# Patient Record
Sex: Male | Born: 1955 | Smoking: Never smoker
Health system: Southern US, Community
[De-identification: ages and names within clinical notes are randomized; demographics above are authoritative.]

## PROBLEM LIST (undated history)

## (undated) DIAGNOSIS — Q858 Other phakomatoses, not elsewhere classified: Secondary | ICD-10-CM

## (undated) DIAGNOSIS — Q8589 Other phakomatoses, not elsewhere classified: Secondary | ICD-10-CM

## (undated) DIAGNOSIS — M109 Gout, unspecified: Secondary | ICD-10-CM

## (undated) DIAGNOSIS — I10 Essential (primary) hypertension: Secondary | ICD-10-CM

## (undated) DIAGNOSIS — E119 Type 2 diabetes mellitus without complications: Secondary | ICD-10-CM

## (undated) DIAGNOSIS — E781 Pure hyperglyceridemia: Secondary | ICD-10-CM

## (undated) DIAGNOSIS — F79 Unspecified intellectual disabilities: Secondary | ICD-10-CM

## (undated) HISTORY — DX: Gout, unspecified: M10.9

## (undated) HISTORY — PX: PORTACATH PLACEMENT: SHX2246

## (undated) HISTORY — DX: Pure hyperglyceridemia: E78.1

## (undated) HISTORY — DX: Unspecified intellectual disabilities: F79

## (undated) HISTORY — DX: Other phakomatoses, not elsewhere classified: Q85.8

## (undated) HISTORY — PX: TONGUE SURGERY: SHX810

## (undated) HISTORY — DX: Essential (primary) hypertension: I10

## (undated) HISTORY — DX: Type 2 diabetes mellitus without complications: E11.9

## (undated) HISTORY — DX: Other phakomatoses, not elsewhere classified: Q85.89

---

## 2015-07-15 DIAGNOSIS — I10 Essential (primary) hypertension: Secondary | ICD-10-CM | POA: Diagnosis not present

## 2015-07-15 DIAGNOSIS — E1165 Type 2 diabetes mellitus with hyperglycemia: Secondary | ICD-10-CM | POA: Diagnosis not present

## 2015-07-16 DIAGNOSIS — E119 Type 2 diabetes mellitus without complications: Secondary | ICD-10-CM | POA: Diagnosis not present

## 2015-07-16 DIAGNOSIS — H40033 Anatomical narrow angle, bilateral: Secondary | ICD-10-CM | POA: Diagnosis not present

## 2015-08-11 DIAGNOSIS — I10 Essential (primary) hypertension: Secondary | ICD-10-CM | POA: Diagnosis not present

## 2015-08-11 DIAGNOSIS — Q858 Other phakomatoses, not elsewhere classified: Secondary | ICD-10-CM | POA: Diagnosis not present

## 2015-08-11 DIAGNOSIS — G8191 Hemiplegia, unspecified affecting right dominant side: Secondary | ICD-10-CM | POA: Diagnosis not present

## 2015-08-11 DIAGNOSIS — M79601 Pain in right arm: Secondary | ICD-10-CM | POA: Diagnosis not present

## 2015-08-11 DIAGNOSIS — S40011A Contusion of right shoulder, initial encounter: Secondary | ICD-10-CM | POA: Diagnosis not present

## 2015-08-11 DIAGNOSIS — Z95828 Presence of other vascular implants and grafts: Secondary | ICD-10-CM | POA: Diagnosis not present

## 2015-08-11 DIAGNOSIS — S299XXA Unspecified injury of thorax, initial encounter: Secondary | ICD-10-CM | POA: Diagnosis not present

## 2015-08-11 DIAGNOSIS — W1839XA Other fall on same level, initial encounter: Secondary | ICD-10-CM | POA: Diagnosis not present

## 2015-08-11 DIAGNOSIS — Z79899 Other long term (current) drug therapy: Secondary | ICD-10-CM | POA: Diagnosis not present

## 2015-08-11 DIAGNOSIS — S20211A Contusion of right front wall of thorax, initial encounter: Secondary | ICD-10-CM | POA: Diagnosis not present

## 2015-08-28 DIAGNOSIS — E1142 Type 2 diabetes mellitus with diabetic polyneuropathy: Secondary | ICD-10-CM | POA: Diagnosis not present

## 2015-08-28 DIAGNOSIS — E1140 Type 2 diabetes mellitus with diabetic neuropathy, unspecified: Secondary | ICD-10-CM | POA: Diagnosis not present

## 2015-08-30 DIAGNOSIS — E1165 Type 2 diabetes mellitus with hyperglycemia: Secondary | ICD-10-CM | POA: Diagnosis not present

## 2015-08-30 DIAGNOSIS — K921 Melena: Secondary | ICD-10-CM | POA: Diagnosis not present

## 2015-09-03 ENCOUNTER — Encounter: Payer: Self-pay | Admitting: Internal Medicine

## 2015-09-16 DIAGNOSIS — Z0000 Encounter for general adult medical examination without abnormal findings: Secondary | ICD-10-CM | POA: Diagnosis not present

## 2015-09-16 DIAGNOSIS — Z6829 Body mass index (BMI) 29.0-29.9, adult: Secondary | ICD-10-CM | POA: Diagnosis not present

## 2015-09-16 DIAGNOSIS — Z1211 Encounter for screening for malignant neoplasm of colon: Secondary | ICD-10-CM | POA: Diagnosis not present

## 2015-09-16 DIAGNOSIS — Z1389 Encounter for screening for other disorder: Secondary | ICD-10-CM | POA: Diagnosis not present

## 2015-09-16 DIAGNOSIS — Z299 Encounter for prophylactic measures, unspecified: Secondary | ICD-10-CM | POA: Diagnosis not present

## 2015-09-24 ENCOUNTER — Ambulatory Visit (INDEPENDENT_AMBULATORY_CARE_PROVIDER_SITE_OTHER): Payer: Medicare Other | Admitting: Gastroenterology

## 2015-09-24 ENCOUNTER — Other Ambulatory Visit: Payer: Self-pay

## 2015-09-24 ENCOUNTER — Encounter: Payer: Self-pay | Admitting: Gastroenterology

## 2015-09-24 VITALS — BP 123/74 | HR 61 | Temp 97.9°F | Ht 62.0 in | Wt 166.6 lb

## 2015-09-24 DIAGNOSIS — K625 Hemorrhage of anus and rectum: Secondary | ICD-10-CM

## 2015-09-24 DIAGNOSIS — R634 Abnormal weight loss: Secondary | ICD-10-CM | POA: Diagnosis not present

## 2015-09-24 MED ORDER — FLEET ENEMA 7-19 GM/118ML RE ENEM: 1.0000 | ENEMA | Freq: Once | RECTAL | Status: DC

## 2015-09-24 MED ORDER — PEG-KCL-NACL-NASULF-NA ASC-C 100 G PO SOLR: 1.0000 | ORAL | Status: DC

## 2015-09-24 NOTE — Progress Notes (Signed)
Primary Care Physician:  Monico Blitz, MD  Primary Gastroenterologist:  Garfield Cornea, MD   Chief Complaint  Patient presents with  . Blood In Stools    HPI:  Gary Richard is a 60 y.o. male here for further evaluation of brbpr per PCP. Patient has been living in the same group home for over 30 years. They have not seen any rectal bleeding but patient is independent with regards to bathroom habits. The episode of brbpr Occurred during one of their outings. Someone noted bright red blood per rectum and reported this. Patient has also had 30 pound weight loss in the past 6 months. Noted abdominal pain, heartburn. If he eats spicy foods he gets diarrhea. Seems to have good appetite. No noted dysphagia. No prior colonoscopy.  Legal guardian, Marlis Edelson.     Current Outpatient Prescriptions  Medication Sig Dispense Refill  . amLODipine (NORVASC) 5 MG tablet Take 5 mg by mouth daily.    Marland Kitchen aspirin 81 MG tablet Take 81 mg by mouth daily.    Marland Kitchen gemfibrozil (LOPID) 600 MG tablet Take 600 mg by mouth 2 (two) times daily before a meal.    . hydrochlorothiazide (MICROZIDE) 12.5 MG capsule Take 12.5 mg by mouth daily.    . metFORMIN (GLUCOPHAGE-XR) 500 MG 24 hr tablet Take 500 mg by mouth daily with breakfast.    . pravastatin (PRAVACHOL) 10 MG tablet Take 10 mg by mouth daily.    . peg 3350 powder (MOVIPREP) 100 g SOLR Take 1 kit (200 g total) by mouth as directed. 1 kit 0  . sodium phosphate (FLEET) 7-19 GM/118ML ENEM Place 133 mLs (1 enema total) rectally once. 1 Bottle 0   No current facility-administered medications for this visit.    Allergies as of 09/24/2015  . (No Known Allergies)    Past Medical History  Diagnosis Date  . Hypertriglyceridemia   . HTN (hypertension)   . DM (diabetes mellitus) ([place])   . 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 Mental retardation   . Gout     Past Surgical History  Procedure Laterality Date  . Tongue surgery      No family history on file.  unknown  Social History   Social History  . Marital Status: Unknown    Spouse Name: N/A  . Number of Children: N/A  . Years of Education: N/A   Occupational History  . Not on file.   Social History Main Topics  . Smoking status: Never Smoker   . Smokeless tobacco: Not on file  . Alcohol Use: No  . Drug Use: No  . Sexual Activity: Not on file   Other Topics Concern  . Not on file   Social History Narrative      ROS: unobtainable.      Physical Examination:  BP 123/74 mmHg  Pulse 61  Temp(Src) 97.9 F (36.6 C) (Oral)  Ht 5' 2"  (1.575 m)  Wt 166 lb 9.6 oz (75.569 kg)  BMI 30.46 kg/m2   General: Well-nourished, well-developed in no acute distress. 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 Limited verbally due to mental retardation. Head: Normocephalic, atraumatic.   Eyes: Conjunctiva pink, no icterus. Mouth: Oropharyngeal mucosa moist and pink , no lesions erythema or exudate. Oropharynx without enlargement or edema. Neck: Supple without thyromegaly, masses, or lymphadenopathy.  Lungs: Clear to auscultation bilaterally.  Heart: Regular rate and rhythm, no murmurs rubs or gallops.  Abdomen: Bowel sounds are normal, nontender, nondistended,  no hepatosplenomegaly or masses, no abdominal bruits or    hernia , no rebound or guarding.   Rectal: Not performed Extremities: No lower extremity edema. No clubbing or deformities.  Neuro: Alert and oriented x 4 , grossly normal neurologically.  Skin: Warm and dry, no rash or jaundice.   Psych: Alert and cooperative, normal mood and affect.

## 2015-09-24 NOTE — Patient Instructions (Signed)
1. Colonoscopy and possible upper endoscopy with Dr. Gala Romney. Please see separate instructions.

## 2015-09-25 NOTE — Patient Instructions (Addendum)
Gary Richard  09/25/2015     @[email or web address] @   Your procedure is scheduled on  09/30/2015  Report to [name] at 12:30 A.M.  Call this number if you have problems the morning of surgery:  [phone]   Remember:  Do not eat food or drink liquids after midnight.  Take these medicines the morning of surgery with A SIP OF WATER  Amlodipine.   Do not wear jewelry, make-up or nail polish.  Do not wear lotions, powders, or perfumes.  You may wear deodorant.  Do not shave 48 hours prior to surgery.  Men may shave face and neck.  Do not bring valuables to the hospital.  [facility] is not responsible for any belongings or valuables.  Contacts, dentures or bridgework may not be worn into surgery.  Leave your suitcase in the car.  After surgery it may be brought to your room.  For patients admitted to the hospital, discharge time will be determined by your treatment team.  Patients discharged the day of surgery will not be allowed to drive home.   Name and phone number of your driver:   family Special instructions:  Follow the diet and prep instructions given to you by Dr Roseanne Kaufman office.  Please read over the following fact sheets that you were given. Coughing and Deep Breathing, Surgical Site Infection Prevention, Anesthesia Post-op Instructions and Care and Recovery After Surgery      Esophagogastroduodenoscopy Esophagogastroduodenoscopy (EGD) is a procedure that is used to examine the lining of the esophagus, stomach, and first part of the small intestine (duodenum). A long, flexible, lighted tube with a camera attached (endoscope) is inserted down the throat to view these organs. This procedure is done to detect problems or abnormalities, such as inflammation, bleeding, ulcers, or growths, in order to treat them. The procedure lasts 5-20 minutes. It is usually an outpatient procedure, but it may need to be performed in a hospital in emergency cases. LET [facility] CARE PROVIDER KNOW ABOUT:  Any allergies you have.  All medicines you are taking, including vitamins, herbs, eye drops, creams, and over-the-counter medicines.  Previous problems you or members of your family have had with the use of anesthetics.  Any blood disorders you have.  Previous surgeries you have had.  Medical conditions you have. RISKS AND COMPLICATIONS Generally, this is a safe procedure. However, problems can occur and include:  Infection.  Bleeding.  Tearing (perforation) of the esophagus, stomach, or duodenum.  Difficulty breathing or not being able to breathe.  Excessive sweating.  Spasms of the larynx.  Slowed heartbeat.  Low blood pressure. BEFORE THE PROCEDURE  Do not eat or drink anything after midnight on the night before the procedure or as directed by your health care provider.  Do not take your regular medicines before the procedure if your health care provider asks you not to. Ask your health care provider about changing or stopping those medicines.  If you wear dentures, be prepared to remove them before the procedure.  Arrange for someone to drive you home after the procedure. PROCEDURE  A numbing medicine (local anesthetic) may be sprayed in your throat for comfort and to stop you from gagging or coughing.  You will have an IV tube inserted in a vein in your hand or arm. You will receive medicines and fluids through this tube.  You will be given a medicine to relax you (sedative).  A pain reliever will be given through the IV tube.  A mouth guard may be placed in your mouth to protect your teeth and to keep you from biting on the endoscope.  You will be asked to lie on your left side.  The endoscope will be inserted down your throat and into your esophagus, stomach, and duodenum.  Air will be put through the endoscope to allow your health care provider to clearly view the lining of your esophagus.  The lining of your  esophagus, stomach, and duodenum will be examined. During the exam, your health care provider may:  Remove tissue to be examined under a microscope (biopsy) for inflammation, infection, or other medical problems.  Remove growths.  Remove objects (foreign bodies) that are stuck.  Treat any bleeding with medicines or other devices that stop tissues from bleeding (hot cautery, clipping devices).  Widen (dilate) or stretch narrowed areas of your esophagus and stomach.  The endoscope will be withdrawn. AFTER THE PROCEDURE  You will be taken to a recovery area for observation. Your blood pressure, heart rate, breathing rate, and blood oxygen level will be monitored often until the medicines you were given have worn off.  Do not eat or drink anything until the numbing medicine has worn off and your gag reflex has returned. You may choke.  Your health care provider should be able to discuss his or her findings with you. It will take longer to discuss the test results if any biopsies were taken.   This information is not intended to replace advice given to you by your health care provider. Make sure you discuss any questions you have with your health care provider.   Document Released: 07/31/2004 Document Revised: 04/20/2014 Document Reviewed: 03/02/2012 Elsevier Interactive Patient Education 2016 [place]. Esophagogastroduodenoscopy, Care After Refer to this sheet in the next few weeks. These instructions provide you with information about caring for yourself after your procedure. Your health care provider may also give you more specific instructions. Your treatment has been planned according to current medical practices, but problems sometimes occur. Call your health care provider if you have any problems or questions after your procedure. WHAT TO EXPECT AFTER THE PROCEDURE After your procedure, it is typical to feel:  Soreness in your throat.  Pain with swallowing.  Sick to your  stomach (nauseous).  Bloated.  Dizzy.  Fatigued. HOME CARE INSTRUCTIONS  Do not eat or drink anything until the numbing medicine (local anesthetic) has worn off and your gag reflex has returned. You will know that the local anesthetic has worn off when you can swallow comfortably.  Do not drive or operate machinery until directed by your health care provider.  Take medicines only as directed by your health care provider. SEEK MEDICAL CARE IF:   You cannot stop coughing.  You are not urinating at all or less than usual. SEEK IMMEDIATE MEDICAL CARE IF:  You have difficulty swallowing.  You cannot eat or drink.  You have worsening throat or chest pain.  You have dizziness or lightheadedness or you faint.  You have nausea or vomiting.  You have chills.  You have a fever.  You have severe abdominal pain.  You have black, tarry, or bloody stools.   This information is not intended to replace advice given to you by your health care provider. Make sure you discuss any questions you have with your health care provider.   Document Released: 03/16/2012 Document Revised: 04/20/2014 Document Reviewed: 03/16/2012 Elsevier Interactive  Patient Education 2016 [employer] Colonoscopy A colonoscopy is an exam to look at the entire large intestine (colon). This exam can help find problems such as tumors, polyps, inflammation, and areas of bleeding. The exam takes about 1 hour.  LET [facility] CARE PROVIDER KNOW ABOUT:   Any allergies you have.  All medicines you are taking, including vitamins, herbs, eye drops, creams, and over-the-counter medicines.  Previous problems you or members of your family have had with the use of anesthetics.  Any blood disorders you have.  Previous surgeries you have had.  Medical conditions you have. RISKS AND COMPLICATIONS  Generally, this is a safe procedure. However, as with any procedure, complications can occur. Possible complications  include:  Bleeding.  Tearing or rupture of the colon wall.  Reaction to medicines given during the exam.  Infection (rare). BEFORE THE PROCEDURE   Ask your health care provider about changing or stopping your regular medicines.  You may be prescribed an oral bowel prep. This involves drinking a large amount of medicated liquid, starting the day before your procedure. The liquid will cause you to have multiple loose stools until your stool is almost clear or light green. This cleans out your colon in preparation for the procedure.  Do not eat or drink anything else once you have started the bowel prep, unless your health care provider tells you it is safe to do so.  Arrange for someone to drive you home after the procedure. PROCEDURE   You will be given medicine to help you relax (sedative).  You will lie on your side with your knees bent.  A long, flexible tube with a light and camera on the end (colonoscope) will be inserted through the rectum and into the colon. The camera sends video back to a computer screen as it moves through the colon. The colonoscope also releases carbon dioxide gas to inflate the colon. This helps your health care provider see the area better.  During the exam, your health care provider may take a small tissue sample (biopsy) to be examined under a microscope if any abnormalities are found.  The exam is finished when the entire colon has been viewed. AFTER THE PROCEDURE   Do not drive for 24 hours after the exam.  You may have a small amount of blood in your stool.  You may pass moderate amounts of gas and have mild abdominal cramping or bloating. This is caused by the gas used to inflate your colon during the exam.  Ask when your test results will be ready and how you will get your results. Make sure you get your test results.   This information is not intended to replace advice given to you by your health care provider. Make sure you discuss any  questions you have with your health care provider.   Document Released: 03/27/2000 Document Revised: 01/18/2013 Document Reviewed: 12/05/2012 Elsevier Interactive Patient Education 2016 [employer]. Colonoscopy, Care After Refer to this sheet in the next few weeks. These instructions provide you with information on caring for yourself after your procedure. Your health care provider may also give you more specific instructions. Your treatment has been planned according to current medical practices, but problems sometimes occur. Call your health care provider if you have any problems or questions after your procedure. WHAT TO EXPECT AFTER THE PROCEDURE  After your procedure, it is typical to have the following:  A small amount of blood in your stool.  Moderate amounts of gas  and mild abdominal cramping or bloating. HOME CARE INSTRUCTIONS  Do not drive, operate machinery, or sign important documents for 24 hours.  You may shower and resume your regular physical activities, but move at a slower pace for the first 24 hours.  Take frequent rest periods for the first 24 hours.  Walk around or put a warm pack on your abdomen to help reduce abdominal cramping and bloating.  Drink enough fluids to keep your urine clear or pale yellow.  You may resume your normal diet as instructed by your health care provider. Avoid heavy or fried foods that are hard to digest.  Avoid drinking alcohol for 24 hours or as instructed by your health care provider.  Only take over-the-counter or prescription medicines as directed by your health care provider.  If a tissue sample (biopsy) was taken during your procedure:  Do not take aspirin or blood thinners for 7 days, or as instructed by your health care provider.  Do not drink alcohol for 7 days, or as instructed by your health care provider.  Eat soft foods for the first 24 hours. SEEK MEDICAL CARE IF: You have persistent spotting of blood in your stool  2-3 days after the procedure. SEEK IMMEDIATE MEDICAL CARE IF:  You have more than a small spotting of blood in your stool.  You pass large blood clots in your stool.  Your abdomen is swollen (distended).  You have nausea or vomiting.  You have a fever.  You have increasing abdominal pain that is not relieved with medicine.   This information is not intended to replace advice given to you by your health care provider. Make sure you discuss any questions you have with your health care provider.   Document Released: 11/12/2003 Document Revised: 01/18/2013 Document Reviewed: 12/05/2012 Elsevier Interactive Patient Education 2016 [employer] PATIENT INSTRUCTIONS POST-ANESTHESIA  IMMEDIATELY FOLLOWING SURGERY:  Do not drive or operate machinery for the first twenty four hours after surgery.  Do not make any important decisions for twenty four hours after surgery or while taking narcotic pain medications or sedatives.  If you develop intractable nausea and vomiting or a severe headache please notify your doctor immediately.  FOLLOW-UP:  Please make an appointment with your surgeon as instructed. You do not need to follow up with anesthesia unless specifically instructed to do so.  WOUND CARE INSTRUCTIONS (if applicable):  Keep a dry clean dressing on the anesthesia/puncture wound site if there is drainage.  Once the wound has quit draining you may leave it open to air.  Generally you should leave the bandage intact for twenty four hours unless there is drainage.  If the epidural site drains for more than 36-48 hours please call the anesthesia department.  QUESTIONS?:  Please feel free to call your physician or the hospital operator if you have any questions, and they will be happy to assist you.

## 2015-09-26 ENCOUNTER — Encounter (HOSPITAL_COMMUNITY): Payer: Self-pay

## 2015-09-26 ENCOUNTER — Encounter: Payer: Self-pay | Admitting: Gastroenterology

## 2015-09-26 ENCOUNTER — Other Ambulatory Visit: Payer: Self-pay

## 2015-09-26 ENCOUNTER — Encounter (HOSPITAL_COMMUNITY)
Admission: RE | Admit: 2015-09-26 | Discharge: 2015-09-26 | Disposition: A | Discharge status: Home / Self Care | Payer: Medicare Other | Source: Ambulatory Visit | Attending: Internal Medicine | Admitting: Internal Medicine

## 2015-09-26 DIAGNOSIS — R634 Abnormal weight loss: Secondary | ICD-10-CM | POA: Insufficient documentation

## 2015-09-26 DIAGNOSIS — Z01810 Encounter for preprocedural cardiovascular examination: Secondary | ICD-10-CM | POA: Diagnosis not present

## 2015-09-26 DIAGNOSIS — K625 Hemorrhage of anus and rectum: Secondary | ICD-10-CM | POA: Diagnosis not present

## 2015-09-26 DIAGNOSIS — Z6830 Body mass index (BMI) 30.0-30.9, adult: Secondary | ICD-10-CM | POA: Insufficient documentation

## 2015-09-26 DIAGNOSIS — Z01812 Encounter for preprocedural laboratory examination: Secondary | ICD-10-CM | POA: Insufficient documentation

## 2015-09-26 LAB — BASIC METABOLIC PANEL
ANION GAP: 11 (ref 5–15)
BUN: 16 mg/dL (ref 6–20)
CHLORIDE: 96 mmol/L — AB (ref 101–111)
CO2: 31 mmol/L (ref 22–32)
Calcium: 10 mg/dL (ref 8.9–10.3)
Creatinine, Ser: 0.51 mg/dL — ABNORMAL LOW (ref 0.61–1.24)
GFR calc Af Amer: >60 mL/min (ref >60)
GFR calc non Af Amer: >60 mL/min (ref >60)
GLUCOSE: 101 mg/dL — AB (ref 65–99)
POTASSIUM: 3.3 mmol/L — AB (ref 3.5–5.1)
Sodium: 138 mmol/L (ref 135–145)

## 2015-09-26 LAB — CBC WITH DIFFERENTIAL/PLATELET
BASOS ABS: 0 10*3/uL (ref 0.0–0.1)
Basophils Relative: 1 %
EOS PCT: 2 %
Eosinophils Absolute: 0.1 10*3/uL (ref 0.0–0.7)
HEMATOCRIT: 44.3 % (ref 39.0–52.0)
Hemoglobin: 14.8 g/dL (ref 13.0–17.0)
LYMPHS ABS: 1.9 10*3/uL (ref 0.7–4.0)
LYMPHS PCT: 34 %
MCH: 31.6 pg (ref 26.0–34.0)
MCHC: 33.4 g/dL (ref 30.0–36.0)
MCV: 94.5 fL (ref 78.0–100.0)
Monocytes Absolute: 0.7 10*3/uL (ref 0.1–1.0)
Monocytes Relative: 13 %
NEUTROS ABS: 2.9 10*3/uL (ref 1.7–7.7)
Neutrophils Relative %: 50 %
Platelets: 204 10*3/uL (ref 150–400)
RBC: 4.69 MIL/uL (ref 4.22–5.81)
RDW: 13.7 % (ref 11.5–15.5)
WBC: 5.6 10*3/uL (ref 4.0–10.5)

## 2015-09-26 NOTE — Assessment & Plan Note (Signed)
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 No prior colonoscopy. No other noted GI symptoms although history is difficult. Recommend colonoscopy and possible upper endoscopy. Discussed with patient's legal guardian, Marlis Edelson and the caregiver. Plan on procedure in the OR with deep sedation given mental retardation.  I have discussed the risks, alternatives, benefits with regards to but not limited to the risk of reaction to medication, bleeding, infection, perforation and the patient is agreeable to proceed. Written consent to be obtained.

## 2015-09-26 NOTE — Progress Notes (Signed)
cc'ed to pcp °

## 2015-09-30 ENCOUNTER — Ambulatory Visit (HOSPITAL_COMMUNITY): Payer: Medicare Other | Admitting: Anesthesiology

## 2015-09-30 ENCOUNTER — Ambulatory Visit (HOSPITAL_COMMUNITY)
Admission: RE | Admit: 2015-09-30 | Discharge: 2015-09-30 | Disposition: A | Discharge status: Home / Self Care | Payer: Medicare Other | Source: Ambulatory Visit | Attending: Internal Medicine | Admitting: Internal Medicine

## 2015-09-30 ENCOUNTER — Encounter (HOSPITAL_COMMUNITY)
Admission: RE | Disposition: A | Discharge status: Home / Self Care | Payer: Self-pay | Source: Ambulatory Visit | Attending: Internal Medicine

## 2015-09-30 ENCOUNTER — Encounter (HOSPITAL_COMMUNITY): Payer: Self-pay | Admitting: *Deleted

## 2015-09-30 DIAGNOSIS — R634 Abnormal weight loss: Secondary | ICD-10-CM | POA: Diagnosis not present

## 2015-09-30 DIAGNOSIS — K3189 Other diseases of stomach and duodenum: Secondary | ICD-10-CM | POA: Insufficient documentation

## 2015-09-30 DIAGNOSIS — E781 Pure hyperglyceridemia: Secondary | ICD-10-CM | POA: Insufficient documentation

## 2015-09-30 DIAGNOSIS — Z7984 Long term (current) use of oral hypoglycemic drugs: Secondary | ICD-10-CM | POA: Diagnosis not present

## 2015-09-30 DIAGNOSIS — F79 Unspecified intellectual disabilities: Secondary | ICD-10-CM | POA: Diagnosis not present

## 2015-09-30 DIAGNOSIS — K5730 Diverticulosis of large intestine without perforation or abscess without bleeding: Secondary | ICD-10-CM | POA: Diagnosis not present

## 2015-09-30 DIAGNOSIS — Z860100 Personal history of colon polyps, unspecified: Secondary | ICD-10-CM | POA: Insufficient documentation

## 2015-09-30 DIAGNOSIS — K2950 Unspecified chronic gastritis without bleeding: Secondary | ICD-10-CM | POA: Insufficient documentation

## 2015-09-30 DIAGNOSIS — K2100 Gastro-esophageal reflux disease with esophagitis, without bleeding: Secondary | ICD-10-CM | POA: Insufficient documentation

## 2015-09-30 DIAGNOSIS — Z7982 Long term (current) use of aspirin: Secondary | ICD-10-CM | POA: Insufficient documentation

## 2015-09-30 DIAGNOSIS — K921 Melena: Secondary | ICD-10-CM

## 2015-09-30 DIAGNOSIS — Z79899 Other long term (current) drug therapy: Secondary | ICD-10-CM | POA: Diagnosis not present

## 2015-09-30 DIAGNOSIS — K2970 Gastritis, unspecified, without bleeding: Secondary | ICD-10-CM | POA: Diagnosis not present

## 2015-09-30 DIAGNOSIS — K209 Esophagitis, unspecified: Secondary | ICD-10-CM

## 2015-09-30 DIAGNOSIS — D122 Benign neoplasm of ascending colon: Secondary | ICD-10-CM

## 2015-09-30 DIAGNOSIS — E119 Type 2 diabetes mellitus without complications: Secondary | ICD-10-CM | POA: Insufficient documentation

## 2015-09-30 DIAGNOSIS — I10 Essential (primary) hypertension: Secondary | ICD-10-CM | POA: Insufficient documentation

## 2015-09-30 DIAGNOSIS — K449 Diaphragmatic hernia without obstruction or gangrene: Secondary | ICD-10-CM | POA: Insufficient documentation

## 2015-09-30 DIAGNOSIS — K625 Hemorrhage of anus and rectum: Secondary | ICD-10-CM | POA: Diagnosis not present

## 2015-09-30 HISTORY — PX: ESOPHAGOGASTRODUODENOSCOPY (EGD) WITH PROPOFOL: SHX5813

## 2015-09-30 HISTORY — PX: COLONOSCOPY WITH PROPOFOL: SHX5780

## 2015-09-30 LAB — GLUCOSE, CAPILLARY
Glucose-Capillary: 100 mg/dL — ABNORMAL HIGH (ref 65–99)
Glucose-Capillary: 83 mg/dL (ref 65–99)

## 2015-09-30 SURGERY — COLONOSCOPY WITH PROPOFOL
Anesthesia: Monitor Anesthesia Care

## 2015-09-30 MED ORDER — LIDOCAINE HCL (CARDIAC) 10 MG/ML IV SOLN
INTRAVENOUS | Status: DC | PRN
  Administered 2015-09-30: 25 mg via INTRAVENOUS

## 2015-09-30 MED ORDER — GLYCOPYRROLATE 0.2 MG/ML IJ SOLN
0.2000 mg | Freq: Once | INTRAMUSCULAR | Status: AC
  Administered 2015-09-30: 0.2 mg via INTRAVENOUS

## 2015-09-30 MED ORDER — FENTANYL CITRATE (PF) 100 MCG/2ML IJ SOLN
25.0000 ug | INTRAMUSCULAR | Status: AC
  Administered 2015-09-30: 25 ug via INTRAVENOUS

## 2015-09-30 MED ORDER — EPHEDRINE SULFATE 50 MG/ML IJ SOLN
INTRAMUSCULAR | Status: AC
  Filled 2015-09-30: qty 1

## 2015-09-30 MED ORDER — FENTANYL CITRATE (PF) 100 MCG/2ML IJ SOLN
INTRAMUSCULAR | Status: AC
  Filled 2015-09-30: qty 2

## 2015-09-30 MED ORDER — PROPOFOL 500 MG/50ML IV EMUL
INTRAVENOUS | Status: DC | PRN
  Administered 2015-09-30: 35 ug/kg/min via INTRAVENOUS

## 2015-09-30 MED ORDER — GLYCOPYRROLATE 0.2 MG/ML IJ SOLN
INTRAMUSCULAR | Status: AC
  Filled 2015-09-30: qty 1

## 2015-09-30 MED ORDER — FENTANYL CITRATE (PF) 100 MCG/2ML IJ SOLN: 25.0000 ug | INTRAMUSCULAR | Status: DC | PRN

## 2015-09-30 MED ORDER — ONDANSETRON HCL 4 MG/2ML IJ SOLN
INTRAMUSCULAR | Status: AC
  Filled 2015-09-30: qty 2

## 2015-09-30 MED ORDER — MIDAZOLAM HCL 2 MG/2ML IJ SOLN
INTRAMUSCULAR | Status: AC
  Filled 2015-09-30: qty 2

## 2015-09-30 MED ORDER — MIDAZOLAM HCL 2 MG/2ML IJ SOLN
1.0000 mg | INTRAMUSCULAR | Status: DC | PRN
  Administered 2015-09-30: 2 mg via INTRAVENOUS

## 2015-09-30 MED ORDER — LIDOCAINE VISCOUS 2 % MT SOLN
OROMUCOSAL | Status: AC
  Filled 2015-09-30: qty 15

## 2015-09-30 MED ORDER — SODIUM CHLORIDE 0.9 % IJ SOLN
INTRAMUSCULAR | Status: AC
  Filled 2015-09-30: qty 10

## 2015-09-30 MED ORDER — ONDANSETRON HCL 4 MG/2ML IJ SOLN: 4.0000 mg | Freq: Once | INTRAMUSCULAR | Status: DC | PRN

## 2015-09-30 MED ORDER — EPINEPHRINE HCL 0.1 MG/ML IJ SOSY
PREFILLED_SYRINGE | INTRAMUSCULAR | Status: AC
  Filled 2015-09-30: qty 10

## 2015-09-30 MED ORDER — LACTATED RINGERS IV SOLN
INTRAVENOUS | Status: DC
  Administered 2015-09-30: 1000 mL via INTRAVENOUS

## 2015-09-30 MED ORDER — ONDANSETRON HCL 4 MG/2ML IJ SOLN
4.0000 mg | Freq: Once | INTRAMUSCULAR | Status: AC
  Administered 2015-09-30: 4 mg via INTRAVENOUS

## 2015-09-30 MED ORDER — GLYCOPYRROLATE 0.2 MG/ML IJ SOLN
INTRAMUSCULAR | Status: DC | PRN
  Administered 2015-09-30: 0.2 mg via INTRAVENOUS

## 2015-09-30 MED ORDER — LIDOCAINE VISCOUS 2 % MT SOLN
5.0000 mL | Freq: Two times a day (BID) | OROMUCOSAL | Status: AC
  Administered 2015-09-30 (×2): 5 mL via OROMUCOSAL

## 2015-09-30 NOTE — Anesthesia Postprocedure Evaluation (Signed)
Anesthesia Post Note  Patient: JYAIR HAMMERSLEY  Procedure(s) Performed: Procedure(s) (LRB): COLONOSCOPY WITH PROPOFOL (N/A) ESOPHAGOGASTRODUODENOSCOPY (EGD) WITH PROPOFOL (N/A)  Patient location during evaluation: PACU Anesthesia Type: MAC Level of consciousness: awake and alert and oriented Pain management: pain level controlled Vital Signs Assessment: post-procedure vital signs reviewed and stable Respiratory status: spontaneous breathing, respiratory function stable and patient connected to face mask oxygen Cardiovascular status: stable Postop Assessment: no signs of nausea or vomiting Anesthetic complications: no    Last Vitals:  Filed Vitals:   09/30/15 1305 09/30/15 1310  BP: 118/68 118/68  Pulse:    Temp:    Resp: 16 17    Last Pain: There were no vitals filed for this visit.               ADAMS, AMY A

## 2015-09-30 NOTE — Discharge Instructions (Signed)
GERD, diverticulosis and colon polyp information provided  Begin Protonix 40 mg daily  Further recommendations to follow pending review of pathology report  No MRI in the future until clip gone   Colonoscopy Discharge Instructions  Read the instructions outlined below and refer to this sheet in the next few weeks. These discharge instructions provide you with general information on caring for yourself after you leave the hospital. Your doctor may also give you specific instructions. While your treatment has been planned according to the most current medical practices available, unavoidable complications occasionally occur. If you have any problems or questions after discharge, call Dr. Gala Richard at [phone]. ACTIVITY  You may resume your regular activity, but move at a slower pace for the next 24 hours.   Take frequent rest periods for the next 24 hours.   Walking will help get rid of the air and reduce the bloated feeling in your belly (abdomen).   No driving for 24 hours (because of the medicine (anesthesia) used during the test).    Do not sign any important legal documents or operate any machinery for 24 hours (because of the anesthesia used during the test).  NUTRITION  Drink plenty of fluids.   You may resume your normal diet as instructed by your doctor.   Begin with a light meal and progress to your normal diet. Heavy or fried foods are harder to digest and may make you feel sick to your stomach (nauseated).   Avoid alcoholic beverages for 24 hours or as instructed.  MEDICATIONS  You may resume your normal medications unless your doctor tells you otherwise.  WHAT YOU CAN EXPECT TODAY  Some feelings of bloating in the abdomen.   Passage of more gas than usual.   Spotting of blood in your stool or on the toilet paper.  IF YOU HAD POLYPS REMOVED DURING THE COLONOSCOPY:  No aspirin products for 7 days or as instructed.   No alcohol for 7 days or as instructed.   Eat  a soft diet for the next 24 hours.  FINDING OUT THE RESULTS OF YOUR TEST Not all test results are available during your visit. If your test results are not back during the visit, make an appointment with your caregiver to find out the results. Do not assume everything is normal if you have not heard from your caregiver or the medical facility. It is important for you to follow up on all of your test results.  SEEK IMMEDIATE MEDICAL ATTENTION IF:  You have more than a spotting of blood in your stool.   Your belly is swollen (abdominal distention).   You are nauseated or vomiting.   You have a temperature over 101.   You have abdominal pain or discomfort that is severe or gets worse throughout the day. The risks, benefits, limitations, alternatives and imponderables have been reviewed with the patient. Potential for esophageal dilation, biopsy, etc. have also been reviewed.  Questions have been answered. All parties agreeable.   Pantoprazole tablets What is this medicine? PANTOPRAZOLE (pan TOE pra zole) prevents the production of acid in the stomach. It is used to treat gastroesophageal reflux disease (GERD), inflammation of the esophagus, and Zollinger-Ellison syndrome. This medicine may be used for other purposes; ask your health care provider or pharmacist if you have questions. What should I tell my health care provider before I take this medicine? They need to know if you have any of these conditions: -liver disease -low levels of magnesium in the  blood -an unusual or allergic reaction to omeprazole, lansoprazole, pantoprazole, rabeprazole, other medicines, foods, dyes, or preservatives -pregnant or trying to get pregnant -breast-feeding How should I use this medicine? Take this medicine by mouth. Swallow the tablets whole with a drink of water. Follow the directions on the prescription label. Do not crush, break, or chew. Take your medicine at regular intervals. Do not take your  medicine more often than directed. Talk to your pediatrician regarding the use of this medicine in children. While this drug may be prescribed for children as young as 5 years for selected conditions, precautions do apply. Overdosage: If you think you have taken too much of this medicine contact a poison control center or emergency room at once. NOTE: This medicine is only for you. Do not share this medicine with others. What if I miss a dose? If you miss a dose, take it as soon as you can. If it is almost time for your next dose, take only that dose. Do not take double or extra doses. What may interact with this medicine? Do not take this medicine with any of the following medications: -atazanavir -nelfinavir This medicine may also interact with the following medications: -ampicillin -delavirdine -erlotinib -iron salts -medicines for fungal infections like ketoconazole, itraconazole and voriconazole -methotrexate -mycophenolate mofetil -warfarin This list may not describe all possible interactions. Give your health care provider a list of all the medicines, herbs, non-prescription drugs, or dietary supplements you use. Also tell them if you smoke, drink alcohol, or use illegal drugs. Some items may interact with your medicine. What should I watch for while using this medicine? It can take several days before your stomach pain gets better. Check with your doctor or health care professional if your condition does not start to get better, or if it gets worse. You may need blood work done while you are taking this medicine. What side effects may I notice from receiving this medicine? Side effects that you should report to your doctor or health care professional as soon as possible: -allergic reactions like skin rash, itching or hives, swelling of the face, lips, or tongue -bone, muscle or joint pain -breathing problems -chest pain or chest tightness -dark yellow or brown  urine -dizziness -fast, irregular heartbeat -feeling faint or lightheaded -fever or sore throat -muscle spasm -palpitations -redness, blistering, peeling or loosening of the skin, including inside the mouth -seizures -tremors -unusual bleeding or bruising -unusually weak or tired -yellowing of the eyes or skin Side effects that usually do not require medical attention (Report these to your doctor or health care professional if they continue or are bothersome.): -constipation -diarrhea -dry mouth -headache -nausea This list may not describe all possible side effects. Call your doctor for medical advice about side effects. You may report side effects to FDA at 1-800-FDA-1088. Where should I keep my medicine? Keep out of the reach of children. Store at room temperature between 15 and 30 degrees C (59 and 86 degrees F). Protect from light and moisture. Throw away any unused medicine after the expiration date. NOTE: This sheet is a summary. It may not cover all possible information. If you have questions about this medicine, talk to your doctor, pharmacist, or health care provider.    2016, Elsevier/Gold Standard. (2014-05-18 14:45:56)   Diverticulosis Diverticulosis is the condition that develops when small pouches (diverticula) form in the wall of your colon. Your colon, or large intestine, is where water is absorbed and stool is formed. The pouches form  when the inside layer of your colon pushes through weak spots in the outer layers of your colon. CAUSES  No one knows exactly what causes diverticulosis. RISK FACTORS  Being older than 63. Your risk for this condition increases with age. Diverticulosis is rare in people younger than 40 years. By age 54, almost everyone has it.  Eating a low-fiber diet.  Being frequently constipated.  Being overweight.  Not getting enough exercise.  Smoking.  Taking over-the-counter pain medicines, like aspirin and ibuprofen. SYMPTOMS   Most people with diverticulosis do not have symptoms. DIAGNOSIS  Because diverticulosis often has no symptoms, health care providers often discover the condition during an exam for other colon problems. In many cases, a health care provider will diagnose diverticulosis while using a flexible scope to examine the colon (colonoscopy). TREATMENT  If you have never developed an infection related to diverticulosis, you may not need treatment. If you have had an infection before, treatment may include:  Eating more fruits, vegetables, and grains.  Taking a fiber supplement.  Taking a live bacteria supplement (probiotic).  Taking medicine to relax your colon. HOME CARE INSTRUCTIONS   Drink at least 6-8 glasses of water each day to prevent constipation.  Try not to strain when you have a bowel movement.  Keep all follow-up appointments. If you have had an infection before:  Increase the fiber in your diet as directed by your health care provider or dietitian.  Take a dietary fiber supplement if your health care provider approves.  Only take medicines as directed by your health care provider. SEEK MEDICAL CARE IF:   You have abdominal pain.  You have bloating.  You have cramps.  You have not gone to the bathroom in 3 days. SEEK IMMEDIATE MEDICAL CARE IF:   Your pain gets worse.  Yourbloating becomes very bad.  You have a fever or chills, and your symptoms suddenly get worse.  You begin vomiting.  You have bowel movements that are bloody or black. MAKE SURE YOU:  Understand these instructions.  Will watch your condition.  Will get help right away if you are not doing well or get worse.   This information is not intended to replace advice given to you by your health care provider. Make sure you discuss any questions you have with your health care provider.   Document Released: 12/26/2003 Document Revised: 04/04/2013 Document Reviewed: 02/22/2013 Elsevier Interactive  Patient Education 2016 Elsevier Inc.   Colon Polyps Polyps are lumps of extra tissue growing inside the body. Polyps can grow in the large intestine (colon). Most colon polyps are noncancerous (benign). However, some colon polyps can become cancerous over time. Polyps that are larger than a pea may be harmful. To be safe, caregivers remove and test all polyps. CAUSES  Polyps form when mutations in the genes cause your cells to grow and divide even though no more tissue is needed. RISK FACTORS There are a number of risk factors that can increase your chances of getting colon polyps. They include:  Being older than 50 years.  Family history of colon polyps or colon cancer.  Long-term colon diseases, such as colitis or Crohn disease.  Being overweight.  Smoking.  Being inactive.  Drinking too much alcohol. SYMPTOMS  Most small polyps do not cause symptoms. If symptoms are present, they may include:  Blood in the stool. The stool may look dark red or black.  Constipation or diarrhea that lasts longer than 1 week. DIAGNOSIS People often do  not know they have polyps until their caregiver finds them during a regular checkup. Your caregiver can use 4 tests to check for polyps:  Digital rectal exam. The caregiver wears gloves and feels inside the rectum. This test would find polyps only in the rectum.  Barium enema. The caregiver puts a liquid called barium into your rectum before taking X-rays of your colon. Barium makes your colon look white. Polyps are dark, so they are easy to see in the X-ray pictures.  Sigmoidoscopy. A thin, flexible tube (sigmoidoscope) is placed into your rectum. The sigmoidoscope has a light and tiny camera in it. The caregiver uses the sigmoidoscope to look at the last third of your colon.  Colonoscopy. This test is like sigmoidoscopy, but the caregiver looks at the entire colon. This is the most common method for finding and removing polyps. TREATMENT  Any  polyps will be removed during a sigmoidoscopy or colonoscopy. The polyps are then tested for cancer. PREVENTION  To help lower your risk of getting more colon polyps:  Eat plenty of fruits and vegetables. Avoid eating fatty foods.  Do not smoke.  Avoid drinking alcohol.  Exercise every day.  Lose weight if recommended by your caregiver.  Eat plenty of calcium and folate. Foods that are rich in calcium include milk, cheese, and broccoli. Foods that are rich in folate include chickpeas, kidney beans, and spinach. HOME CARE INSTRUCTIONS Keep all follow-up appointments as directed by your caregiver. You may need periodic exams to check for polyps. SEEK MEDICAL CARE IF: You notice bleeding during a bowel movement.   This information is not intended to replace advice given to you by your health care provider. Make sure you discuss any questions you have with your health care provider.   Document Released: 12/25/2003 Document Revised: 04/20/2014 Document Reviewed: 06/09/2011 Elsevier Interactive Patient Education 2016 [place].   Gastroesophageal Reflux Disease, Adult Normally, food travels down the esophagus and stays in the stomach to be digested. However, when a person has gastroesophageal reflux disease (GERD), food and stomach acid move back up into the esophagus. When this happens, the esophagus becomes sore and inflamed. Over time, GERD can create small holes (ulcers) in the lining of the esophagus.  CAUSES This condition is caused by a problem with the muscle between the esophagus and the stomach (lower esophageal sphincter, or LES). Normally, the LES muscle closes after food passes through the esophagus to the stomach. When the LES is weakened or abnormal, it does not close properly, and that allows food and stomach acid to go back up into the esophagus. The LES can be weakened by certain dietary substances, medicines, and medical conditions, including:  Tobacco  use.  Pregnancy.  Having a hiatal hernia.  Heavy alcohol use.  Certain foods and beverages, such as coffee, chocolate, onions, and peppermint. RISK FACTORS This condition is more likely to develop in:  People who have an increased body weight.  People who have connective tissue disorders.  People who use NSAID medicines. SYMPTOMS Symptoms of this condition include:  Heartburn.  Difficult or painful swallowing.  The feeling of having a lump in the throat.  Abitter taste in the mouth.  Bad breath.  Having a large amount of saliva.  Having an upset or bloated stomach.  Belching.  Chest pain.  Shortness of breath or wheezing.  Ongoing (chronic) cough or a night-time cough.  Wearing away of tooth enamel.  Weight loss. Different conditions can cause chest pain. Make sure to  see your health care provider if you experience chest pain. DIAGNOSIS Your health care provider will take a medical history and perform a physical exam. To determine if you have mild or severe GERD, your health care provider may also monitor how you respond to treatment. You may also have other tests, including:  An endoscopy toexamine your stomach and esophagus with a small camera.  A test thatmeasures the acidity level in your esophagus.  A test thatmeasures how much pressure is on your esophagus.  A barium swallow or modified barium swallow to show the shape, size, and functioning of your esophagus. TREATMENT The goal of treatment is to help relieve your symptoms and to prevent complications. Treatment for this condition may vary depending on how severe your symptoms are. Your health care provider may recommend:  Changes to your diet.  Medicine.  Surgery. HOME CARE INSTRUCTIONS Diet  Follow a diet as recommended by your health care provider. This may involve avoiding foods and drinks such as:  Coffee and tea (with or without caffeine).  Drinks that containalcohol.  Energy  drinks and sports drinks.  Carbonated drinks or sodas.  Chocolate and cocoa.  Peppermint and mint flavorings.  Garlic and onions.  Horseradish.  Spicy and acidic foods, including peppers, chili powder, curry powder, vinegar, hot sauces, and barbecue sauce.  Citrus fruit juices and citrus fruits, such as oranges, lemons, and limes.  Tomato-based foods, such as red sauce, chili, salsa, and pizza with red sauce.  Fried and fatty foods, such as donuts, french fries, potato chips, and high-fat dressings.  High-fat meats, such as hot dogs and fatty cuts of red and white meats, such as rib eye steak, sausage, ham, and bacon.  High-fat dairy items, such as whole milk, butter, and cream cheese.  Eat small, frequent meals instead of large meals.  Avoid drinking large amounts of liquid with your meals.  Avoid eating meals during the 2-3 hours before bedtime.  Avoid lying down right after you eat.  Do not exercise right after you eat. General Instructions  Pay attention to any changes in your symptoms.  Take over-the-counter and prescription medicines only as told by your health care provider. Do not take aspirin, ibuprofen, or other NSAIDs unless your health care provider told you to do so.  Do not use any tobacco products, including cigarettes, chewing tobacco, and e-cigarettes. If you need help quitting, ask your health care provider.  Wear loose-fitting clothing. Do not wear anything tight around your waist that causes pressure on your abdomen.  Raise (elevate) the head of your bed 6 inches (15cm).  Try to reduce your stress, such as with yoga or meditation. If you need help reducing stress, ask your health care provider.  If you are overweight, reduce your weight to an amount that is healthy for you. Ask your health care provider for guidance about a safe weight loss goal.  Keep all follow-up visits as told by your health care provider. This is important. SEEK MEDICAL  CARE IF:  You have new symptoms.  You have unexplained weight loss.  You have difficulty swallowing, or it hurts to swallow.  You have wheezing or a persistent cough.  Your symptoms do not improve with treatment.  You have a hoarse voice. SEEK IMMEDIATE MEDICAL CARE IF:  You have pain in your arms, neck, jaw, teeth, or back.  You feel sweaty, dizzy, or light-headed.  You have chest pain or shortness of breath.  You vomit and your vomit looks  like blood or coffee grounds.  You faint.  Your stool is bloody or black.  You cannot swallow, drink, or eat.   This information is not intended to replace advice given to you by your health care provider. Make sure you discuss any questions you have with your health care provider.   Document Released: 01/07/2005 Document Revised: 12/19/2014 Document Reviewed: 07/25/2014 Elsevier Interactive Patient Education [employer].

## 2015-09-30 NOTE — Anesthesia Procedure Notes (Signed)
Procedure Name: MAC Date/Time: 09/30/2015 1:10 PM Performed by: Andree Elk, AMY A Pre-anesthesia Checklist: Patient identified, Emergency Drugs available, Suction available, Patient being monitored and Timeout performed Oxygen Delivery Method: Simple face mask

## 2015-09-30 NOTE — H&P (View-Only) (Signed)
Primary Care Physician:  Monico Blitz, MD  Primary Gastroenterologist:  Garfield Cornea, MD   Chief Complaint  Patient presents with  . Blood In Stools    HPI:  Gary Richard is a 60 y.o. male here for further evaluation of brbpr per PCP. Patient has been living in the same group home for over 30 years. They have not seen any rectal bleeding but patient is independent with regards to bathroom habits. The episode of brbpr Occurred during one of their outings. Someone noted bright red blood per rectum and reported this. Patient has also had 30 pound weight loss in the past 6 months. Noted abdominal pain, heartburn. If he eats spicy foods he gets diarrhea. Seems to have good appetite. No noted dysphagia. No prior colonoscopy.  Legal guardian, Marlis Edelson.     Current Outpatient Prescriptions  Medication Sig Dispense Refill  . amLODipine (NORVASC) 5 MG tablet Take 5 mg by mouth daily.    Marland Kitchen aspirin 81 MG tablet Take 81 mg by mouth daily.    Marland Kitchen gemfibrozil (LOPID) 600 MG tablet Take 600 mg by mouth 2 (two) times daily before a meal.    . hydrochlorothiazide (MICROZIDE) 12.5 MG capsule Take 12.5 mg by mouth daily.    . metFORMIN (GLUCOPHAGE-XR) 500 MG 24 hr tablet Take 500 mg by mouth daily with breakfast.    . pravastatin (PRAVACHOL) 10 MG tablet Take 10 mg by mouth daily.    . peg 3350 powder (MOVIPREP) 100 g SOLR Take 1 kit (200 g total) by mouth as directed. 1 kit 0  . sodium phosphate (FLEET) 7-19 GM/118ML ENEM Place 133 mLs (1 enema total) rectally once. 1 Bottle 0   No current facility-administered medications for this visit.    Allergies as of 09/24/2015  . (No Known Allergies)    Past Medical History  Diagnosis Date  . Hypertriglyceridemia   . HTN (hypertension)   . DM (diabetes mellitus) ([place])   . 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 Mental retardation   . Gout     Past Surgical History  Procedure Laterality Date  . Tongue surgery      No family history on file.  unknown  Social History   Social History  . Marital Status: Unknown    Spouse Name: N/A  . Number of Children: N/A  . Years of Education: N/A   Occupational History  . Not on file.   Social History Main Topics  . Smoking status: Never Smoker   . Smokeless tobacco: Not on file  . Alcohol Use: No  . Drug Use: No  . Sexual Activity: Not on file   Other Topics Concern  . Not on file   Social History Narrative      ROS: unobtainable.      Physical Examination:  BP 123/74 mmHg  Pulse 61  Temp(Src) 97.9 F (36.6 C) (Oral)  Ht 5' 2"  (1.575 m)  Wt 166 lb 9.6 oz (75.569 kg)  BMI 30.46 kg/m2   General: Well-nourished, well-developed in no acute distress. 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 Limited verbally due to mental retardation. Head: Normocephalic, atraumatic.   Eyes: Conjunctiva pink, no icterus. Mouth: Oropharyngeal mucosa moist and pink , no lesions erythema or exudate. Oropharynx without enlargement or edema. Neck: Supple without thyromegaly, masses, or lymphadenopathy.  Lungs: Clear to auscultation bilaterally.  Heart: Regular rate and rhythm, no murmurs rubs or gallops.  Abdomen: Bowel sounds are normal, nontender, nondistended,  no hepatosplenomegaly or masses, no abdominal bruits or    hernia , no rebound or guarding.   Rectal: Not performed Extremities: No lower extremity edema. No clubbing or deformities.  Neuro: Alert and oriented x 4 , grossly normal neurologically.  Skin: Warm and dry, no rash or jaundice.   Psych: Alert and cooperative, normal mood and affect.

## 2015-09-30 NOTE — Interval H&P Note (Signed)
History and Physical Interval Note:  09/30/2015 1:09 PM  Gary Richard  has presented today for surgery, with the diagnosis of rectal bleeding/abnormal weight loss  The various methods of treatment have been discussed with the patient and family. After consideration of risks, benefits and other options for treatment, the patient has consented to  Procedure(s) with comments: COLONOSCOPY WITH PROPOFOL (N/A) - 200 - pt will have legal guardian with him ESOPHAGOGASTRODUODENOSCOPY (EGD) WITH PROPOFOL (N/A) as a surgical intervention .  The patient's history has been reviewed, patient examined, no change in status, stable for surgery.  I have reviewed the patient's chart and labs.  Questions were answered to the patient's satisfaction.     Gary Richard  No change. Patient took his prep according to caregivers. Diagnostic EGD and colonoscopy per plan.  The risks, benefits, limitations, imponderables and alternatives regarding both EGD and colonoscopy have been reviewed with the patient. Questions have been answered. All parties agreeable.

## 2015-09-30 NOTE — Anesthesia Preprocedure Evaluation (Signed)
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  Dental  (+) Poor Dentition, Missing   Pulmonary neg pulmonary ROS,    breath sounds clear to auscultation       Cardiovascular hypertension, Pt. on medications  Rhythm:Regular Rate:Normal     Neuro/Psych PSYCHIATRIC DISORDERS (mental retardation)    GI/Hepatic   Endo/Other  diabetes, Type 2  Renal/GU      Musculoskeletal   Abdominal   Peds  Hematology   Anesthesia Other Findings Sturge-Weber syndrome with facial deformities.  Reproductive/Obstetrics                             Anesthesia Physical Anesthesia Plan  ASA: III  Anesthesia Plan: MAC   Post-op Pain Management:    Induction: Intravenous  Airway Management Planned: Simple Face Mask  Additional Equipment:   Intra-op Plan:   Post-operative Plan:   Informed Consent: I have reviewed the patients History and Physical, chart, labs and discussed the procedure including the risks, benefits and alternatives for the proposed anesthesia with the patient or authorized representative who has indicated his/her understanding and acceptance.     Plan Discussed with:   Anesthesia Plan Comments:         Anesthesia Quick Evaluation

## 2015-09-30 NOTE — Transfer of Care (Signed)
Immediate Anesthesia Transfer of Care Note  Patient: Gary Richard  Procedure(s) Performed: Procedure(s) with comments: COLONOSCOPY WITH PROPOFOL (N/A) - 200 - pt will have legal guardian with him ESOPHAGOGASTRODUODENOSCOPY (EGD) WITH PROPOFOL (N/A)  Patient Location: PACU  Anesthesia Type:MAC  Level of Consciousness: awake, oriented and patient cooperative  Airway & Oxygen Therapy: Patient Spontanous Breathing and Patient connected to face mask oxygen  Post-op Assessment: Report given to RN and Post -op Vital signs reviewed and stable  Post vital signs: Reviewed and stable  Last Vitals:  Filed Vitals:   09/30/15 1305 09/30/15 1310  BP: 118/68 118/68  Pulse:    Temp:    Resp: 16 17    Last Pain: There were no vitals filed for this visit.    Patients Stated Pain Goal: 7 ([phone] [id number])  Complications: No apparent anesthesia complications

## 2015-10-02 NOTE — Op Note (Addendum)
[facility] Patient Name: Gary Richard Procedure Date: 09/30/2015 1:14 PM MRN: [id number] Date of Birth: 03-08-1956 Attending MD: Norvel Richards , MD CSN: [id number] Age: 60 Admit Type: Outpatient Procedure:                Upper GI endoscopy ith gastric biopsy Indications:              Weight loss Providers:                Norvel Richards, MD, Otis Peak B. Sharon Seller, RN,                            Georgeann Oppenheim, Technician Referring MD:              Medicines:                Propofol per Anesthesia, Monitored Anesthesia Care Complications:            No immediate complications. Estimated Blood Loss:     Estimated blood loss was minimal. Procedure:                Pre-Anesthesia Assessment:                           - Prior to the procedure, a History and Physical                            was performed, and patient medications and                            allergies were reviewed. The patient's tolerance of                            previous anesthesia was also reviewed. The risks                            and benefits of the procedure and the sedation                            options and risks were discussed with the patient.                            All questions were answered, and informed consent                            was obtained. Prior Anticoagulants: The patient has                            taken no previous anticoagulant or antiplatelet                            agents. ASA Grade Assessment: II - A patient with                            mild systemic disease. After reviewing the risks  and benefits, the patient was deemed in                            satisfactory condition to undergo the procedure.                           After obtaining informed consent, the endoscope was                            passed under direct vision. Throughout the                            procedure, the patient's blood pressure, pulse, and                           oxygen saturations were monitored continuously. The                            was introduced through the mouth, and advanced to                            the second part of duodenum. The upper GI endoscopy                            was accomplished without difficulty. The patient                            tolerated the procedure well. The patient tolerated                            the procedure well. Scope In: 1:22:19 PM Scope Out: 1:26:18 PM Total Procedure Duration: 0 hours 3 minutes 59 seconds  Findings:      LA Grade A (one or more mucosal breaks less than 5 mm, not extending       between tops of 2 mucosal folds) esophagitis was found 36 to 37 cm from       the incisors. No Barrett's epithelium seen.      Diffuse mild inflammation characterized by erosions, erythema,       friability and granularity was found in the entire examined stomach.       This was biopsied with a cold forceps for histology. Estimated blood       loss was minimal.      The second portion of the duodenum was normal. Estimated blood loss:       none. Impression:               - LA Grade A esophagitis.                           - Gastritis. Biopsied.                           - Normal second portion of the duodenum. Moderate Sedation:      Moderate (conscious) sedation was personally administered by an       anesthesia professional. The following parameters were monitored: oxygen  saturation, heart rate, blood pressure, respiratory rate, EKG, adequacy       of pulmonary ventilation, and response to care. Total physician       intraservice time was 13 minutes. Recommendation:           - Patient has a contact number available for                            emergencies. The signs and symptoms of potential                            delayed complications were discussed with the                            patient. Return to normal activities tomorrow.                             Written discharge instructions were provided to the                            patient.                           - Advance diet as tolerated.                           - Continue present medications.                           - Await pathology results.                           - No repeat upper endoscopy.                           - Return to GI office after studies are complete.                           - Continue present medications. Procedure Code(s):        --- Professional ---                           [phone], Esophagogastroduodenoscopy, flexible,                            transoral; with biopsy, single or multiple Diagnosis Code(s):        --- Professional ---                           K20.9, Esophagitis, unspecified                           K29.70, Gastritis, unspecified, without bleeding                           R63.4, Abnormal weight loss CPT copyright 2016 American Medical Association. All rights reserved. The codes documented in this report are preliminary and upon coder  review may  be revised to meet current compliance requirements. Cristopher Estimable. Alicya Bena, MD Norvel Richards, MD 10/02/2015 10:03:53 AM This report has been signed electronically. Number of Addenda: 0

## 2015-10-02 NOTE — Op Note (Addendum)
[name] Patient Name: Gary Richard Procedure Date: 09/30/2015 1:30 PM MRN: [id number] Date of Birth: [birth year] Attending MD: Norvel Richards , MD CSN: [id number] Age: 60 Admit Type: Outpatient Procedure:                Colonoscopy with snare polypectomy Indications:              Hematochezia, Weight loss Providers:                Norvel Richards, MD, Gwenlyn Fudge RN, RN,                            Georgeann Oppenheim, Technician Referring MD:              Medicines:                Propofol per Anesthesia Complications:            No immediate complications. Estimated Blood Loss:     Estimated blood loss was minimal. Procedure:                Pre-Anesthesia Assessment:                           - Prior to the procedure, a History and Physical                            was performed, and patient medications and                            allergies were reviewed. The patient's tolerance of                            previous anesthesia was also reviewed. The risks                            and benefits of the procedure and the sedation                            options and risks were discussed with the patient.                            All questions were answered, and informed consent                            was obtained. Prior Anticoagulants: The patient has                            taken no previous anticoagulant or antiplatelet                            agents. ASA Grade Assessment: II - A patient with                            mild systemic disease. After reviewing the risks  and benefits, the patient was deemed in                            satisfactory condition to undergo the procedure.                           - Prior to the procedure, a History and Physical                            was performed, and patient medications and                            allergies were reviewed. The patient's tolerance of     previous anesthesia was also reviewed. The risks                            and benefits of the procedure and the sedation                            options and risks were discussed with the patient.                            All questions were answered, and informed consent                            was obtained. Prior Anticoagulants: The patient has                            taken no previous anticoagulant or antiplatelet                            agents. ASA Grade Assessment: III - A patient with                            severe systemic disease. After reviewing the risks                            and benefits, the patient was deemed in                            satisfactory condition to undergo the procedure.                           After obtaining informed consent, the colonoscope                            was passed under direct vision. Throughout the                            procedure, the patient's blood pressure, pulse, and                            oxygen saturations were monitored continuously. The  [id number] scope was introduced through                            the anus and advanced to the the cecum, identified                            by appendiceal orifice and ileocecal valve. The                            entire colon was well visualized. The ileocecal                            valve, appendiceal orifice, and rectum were                            photographed. The patient tolerated the procedure                            well. The quality of the bowel preparation was                            adequate. Scope In: 1:31:01 PM Scope Out: 1:55:41 PM Scope Withdrawal Time: 0 hours 13 minutes 48 seconds  Total Procedure Duration: 0 hours 24 minutes 40 seconds  Findings:      The perianal and digital rectal examinations were normal.      Scattered small-mouthed diverticula were found in the entire colon.      A 8 mm  polyp was found in the ascending colon. The polyp was       semi-pedunculated. The polyp was removed with a hot snare. Resection and       retrieval were complete. Estimated blood loss was minimal.      A second 7 mm polyp was found in the ascending colon. The polyp was       semi-pedunculated. The polyp was removed with a hot snare. Resection and       retrieval were complete. The larger more proximal ascending colon polyp       used to just a bit following polypectomy. It was sealed with 1       resolution clip.      The exam was otherwise without abnormality on direct and retroflexion       views. Patient had a very prominent venous pattern with large veins in       the sigmoid and the rectum. These appear to be vascular. May go along       with Sturg-Weber syndrome. Impression:               - Diverticulosis in the entire examined colon.                           - One 8 mm polyp in the ascending colon, removed                            with a hot snare. Resected and retrieved.                           -  One 7 mm polyp in the ascending colon, removed                            with a hot snare. Resected and retrieved.                            hemostasis clip placed.                           - The examination was otherwise normal on direct                            and retroflexion views. Moderate Sedation:      Moderate (conscious) sedation was personally administered by an       anesthesia professional. The following parameters were monitored: oxygen       saturation, heart rate, blood pressure, respiratory rate, EKG, adequacy       of pulmonary ventilation, and response to care. Total physician       intraservice time was 42 minutes. Recommendation:           - Patient has a contact number available for                            emergencies. The signs and symptoms of potential                            delayed complications were discussed with the                             patient. Return to normal activities tomorrow.                            Written discharge instructions were provided to the                            patient.                           - Advance diet as tolerated.                           - Continue present medications.                           - Repeat colonoscopy date to be determined after                            pending pathology results are reviewed for                            surveillance based on pathology results.                           - Return to GI clinic in 12 weeks. No future MRI  until clip: Procedure Code(s):        --- Professional ---                           [phone], Colonoscopy, flexible; with removal of                            tumor(s), polyp(s), or other lesion(s) by snare                            technique Diagnosis Code(s):        --- Professional ---                           D12.2, Benign neoplasm of ascending colon                           K92.1, Melena (includes Hematochezia)                           R63.4, Abnormal weight loss                           K57.30, Diverticulosis of large intestine without                            perforation or abscess without bleeding CPT copyright 2016 American Medical Association. All rights reserved. The codes documented in this report are preliminary and upon coder review may  be revised to meet current compliance requirements. Cristopher Estimable. Gotti Alwin, MD Norvel Richards, MD 10/02/2015 10:22:09 AM This report has been signed electronically. Number of Addenda: 0

## 2015-10-04 ENCOUNTER — Encounter: Payer: Self-pay | Admitting: Internal Medicine

## 2015-10-07 ENCOUNTER — Telehealth: Payer: Self-pay

## 2015-10-07 ENCOUNTER — Encounter (HOSPITAL_COMMUNITY): Payer: Self-pay | Admitting: Internal Medicine

## 2015-10-07 ENCOUNTER — Encounter: Payer: Self-pay | Admitting: Internal Medicine

## 2015-10-07 NOTE — Telephone Encounter (Signed)
OV MADE °

## 2015-10-07 NOTE — Telephone Encounter (Signed)
Per RMR- Send letter to patient.  Send copy of letter with path to referring provider and PCP.   Patient is follow-up appointment regarding weight loss, etc with extender- 6 weeks

## 2015-10-07 NOTE — Telephone Encounter (Signed)
Letter mailed to the pt. 

## 2015-10-23 DIAGNOSIS — I739 Peripheral vascular disease, unspecified: Secondary | ICD-10-CM | POA: Diagnosis not present

## 2015-10-23 DIAGNOSIS — M25561 Pain in right knee: Secondary | ICD-10-CM | POA: Diagnosis not present

## 2015-10-23 DIAGNOSIS — M25569 Pain in unspecified knee: Secondary | ICD-10-CM | POA: Diagnosis not present

## 2015-10-23 DIAGNOSIS — Z299 Encounter for prophylactic measures, unspecified: Secondary | ICD-10-CM | POA: Diagnosis not present

## 2015-11-14 DIAGNOSIS — E1142 Type 2 diabetes mellitus with diabetic polyneuropathy: Secondary | ICD-10-CM | POA: Diagnosis not present

## 2015-11-14 DIAGNOSIS — E1140 Type 2 diabetes mellitus with diabetic neuropathy, unspecified: Secondary | ICD-10-CM | POA: Diagnosis not present

## 2015-11-19 DIAGNOSIS — E1165 Type 2 diabetes mellitus with hyperglycemia: Secondary | ICD-10-CM | POA: Diagnosis not present

## 2015-11-19 DIAGNOSIS — F79 Unspecified intellectual disabilities: Secondary | ICD-10-CM | POA: Diagnosis not present

## 2015-11-19 DIAGNOSIS — Z299 Encounter for prophylactic measures, unspecified: Secondary | ICD-10-CM | POA: Diagnosis not present

## 2015-11-20 ENCOUNTER — Encounter: Payer: Self-pay | Admitting: Gastroenterology

## 2015-11-20 ENCOUNTER — Ambulatory Visit (INDEPENDENT_AMBULATORY_CARE_PROVIDER_SITE_OTHER): Payer: Medicare Other | Admitting: Gastroenterology

## 2015-11-20 VITALS — BP 137/63 | HR 66 | Temp 97.5°F | Ht 65.0 in | Wt 162.8 lb

## 2015-11-20 DIAGNOSIS — E119 Type 2 diabetes mellitus without complications: Secondary | ICD-10-CM

## 2015-11-20 DIAGNOSIS — R634 Abnormal weight loss: Secondary | ICD-10-CM | POA: Diagnosis not present

## 2015-11-20 NOTE — Progress Notes (Addendum)
Primary Care Physician: Monico Blitz, MD  Primary Gastroenterologist:  Garfield Cornea, MD   Chief Complaint  Patient presents with  . Weight Loss    lost 7 lbs since June    HPI: Gary Richard is a 60 y.o. male with history of Sturge-Weber syndrome who is here for follow-up of weight loss. He recently underwent EGD and colonoscopy for hematochezia, weight loss. He had LA grade a esophagitis, gastritis, 2 colonic adenomas removed from the colon. Hemostasis clip was applied to one of the polypectomy sites. Advised to come back for a colonoscopy in 2 years based on pathology.  Weight down 4 pounds since 09/24/2015. Total of 35 pound weight loss in the past 6 months. Patient unable to provide history. He presents with his legal guardian, sits that left and the caregiver from the group home. There has been no reported issues eating. He eats everything they provide him. No nausea or vomiting. No noted abdominal pain. If he eats spicy foods he has diarrhea. No dysphagia. No blood in the stool or melena. No cough or shortness of breath.     Current Outpatient Prescriptions  Medication Sig Dispense Refill  . amLODipine (NORVASC) 5 MG tablet Take 5 mg by mouth daily.    Marland Kitchen aspirin 81 MG tablet Take 81 mg by mouth daily.    Marland Kitchen gemfibrozil (LOPID) 600 MG tablet Take 600 mg by mouth 2 (two) times daily before a meal.    . hydrochlorothiazide (MICROZIDE) 12.5 MG capsule Take 12.5 mg by mouth daily.    . metFORMIN (GLUCOPHAGE-XR) 500 MG 24 hr tablet Take 500 mg by mouth daily with breakfast.    . pantoprazole (PROTONIX) 40 MG tablet Take 40 mg by mouth daily.    . pravastatin (PRAVACHOL) 10 MG tablet Take 10 mg by mouth daily.     No current facility-administered medications for this visit.     Allergies as of 11/20/2015  . (No Known Allergies)   Past Medical History:  Diagnosis Date  . DM (diabetes mellitus) ([place])   . Gout   . HTN (hypertension)   . Hypertriglyceridemia   . Mental  retardation   . Sturge-Weber syndrome [facility])    Past Surgical History:  Procedure Laterality Date  . COLONOSCOPY WITH PROPOFOL N/A 09/30/2015   RMR: Diverticulosis in the entire colon, 2 polyps removed, pathology with sessile serrated adenoma with cytologic dysplasia.Marland Kitchen Next colonoscopy recommended for June 2019  . ESOPHAGOGASTRODUODENOSCOPY (EGD) WITH PROPOFOL N/A 09/30/2015   RMR: LA grade a esophagitis, gastritis, pathology with focal chronic inactive gastritis, no H pylori.  Marland Kitchen PORTACATH PLACEMENT Right    years ago  . TONGUE SURGERY       [id number] predominantly from caregiver and legal guardian. Patient difficult historian due to his mental retardation.  General: Negative for anorexia,  fever, chills, fatigue, weakness. See history of present illness ENT: Negative for hoarseness, difficulty swallowing , nasal congestion. CV: Negative for chest pain, angina, palpitations, dyspnea on exertion, peripheral edema.  Respiratory: Negative for dyspnea at rest, dyspnea on exertion, cough, sputum, wheezing.  GI: See history of present illness. GU:  Negative for dysuria, hematuria, urinary incontinence, urinary frequency, nocturnal urination.  Endo: See history of present illness  Physical Examination:   BP 137/63   Pulse 66   Temp 97.5 F (36.4 C) (Oral)   Ht 5\' 5"  (1.651 m)   Wt 162 lb 12.8 oz (73.8 kg)   BMI 27.09 kg/m   General: Well-nourished, well-developed  in no acute distress. Port wine stain involving much of the left base, enlargement of the mouth/face. Limited verbal communication due to mental retardation. Eyes: No icterus. Mouth: Oropharyngeal mucosa moist and pink , no lesions erythema or exudate. Lungs: Clear to auscultation bilaterally.  Heart: Regular rate and rhythm, no murmurs rubs or gallops.  Abdomen: Bowel sounds are normal, nontender, nondistended, no hepatosplenomegaly or masses, no abdominal bruits or hernia , no rebound or guarding.   Extremities: No lower  extremity edema. No clubbing or deformities. Neuro: Alert and oriented x 4   Skin: Warm and dry, no jaundice.   Psych: Alert and cooperative, normal mood and affect.  Labs:  Lab Results  Component Value Date   CREATININE 0.51 (L) 09/26/2015   BUN 16 09/26/2015   NA 138 09/26/2015   K 3.3 (L) 09/26/2015   CL 96 (L) 09/26/2015   CO2 31 09/26/2015   Lab Results  Component Value Date   WBC 5.6 09/26/2015   HGB 14.8 09/26/2015   HCT 44.3 09/26/2015   MCV 94.5 09/26/2015   PLT 204 09/26/2015     Imaging Studies: No results found.

## 2015-11-20 NOTE — Assessment & Plan Note (Signed)
60 year old gentleman with history of Sturge-Weber syndrome who presents for follow-up of abnormal weight loss. Recent EGD and colonoscopy done for rectal bleeding, weight loss. Findings as outlined above. Nothing to explain his symptoms. He has lost nearly 20% of his body weight, 35 pounds over the past 6 months. Would recommend excluding malignancy, obtain labs. Plan for CXR Pa/lat and CT A/P with contrast. Further recommendations to follow pending results.

## 2015-11-20 NOTE — Progress Notes (Signed)
cc'ed to pcp °

## 2015-11-20 NOTE — Patient Instructions (Signed)
1. Please have your labs and xrays done as schedule. We will contact you with results when available.

## 2015-11-21 ENCOUNTER — Other Ambulatory Visit: Payer: Self-pay

## 2015-11-24 DIAGNOSIS — S0101XA Laceration without foreign body of scalp, initial encounter: Secondary | ICD-10-CM | POA: Diagnosis not present

## 2015-11-24 DIAGNOSIS — W0110XA Fall on same level from slipping, tripping and stumbling with subsequent striking against unspecified object, initial encounter: Secondary | ICD-10-CM | POA: Diagnosis not present

## 2015-11-24 DIAGNOSIS — J329 Chronic sinusitis, unspecified: Secondary | ICD-10-CM | POA: Diagnosis not present

## 2015-11-24 DIAGNOSIS — Z7984 Long term (current) use of oral hypoglycemic drugs: Secondary | ICD-10-CM | POA: Diagnosis not present

## 2015-11-24 DIAGNOSIS — Z7982 Long term (current) use of aspirin: Secondary | ICD-10-CM | POA: Diagnosis not present

## 2015-11-24 DIAGNOSIS — S0083XA Contusion of other part of head, initial encounter: Secondary | ICD-10-CM | POA: Diagnosis not present

## 2015-11-24 DIAGNOSIS — R569 Unspecified convulsions: Secondary | ICD-10-CM | POA: Diagnosis not present

## 2015-11-24 DIAGNOSIS — S0181XA Laceration without foreign body of other part of head, initial encounter: Secondary | ICD-10-CM | POA: Diagnosis not present

## 2015-11-24 DIAGNOSIS — J019 Acute sinusitis, unspecified: Secondary | ICD-10-CM | POA: Diagnosis not present

## 2015-11-24 DIAGNOSIS — Z8673 Personal history of transient ischemic attack (TIA), and cerebral infarction without residual deficits: Secondary | ICD-10-CM | POA: Diagnosis not present

## 2015-11-24 DIAGNOSIS — Z79899 Other long term (current) drug therapy: Secondary | ICD-10-CM | POA: Diagnosis not present

## 2015-11-24 DIAGNOSIS — Q858 Other phakomatoses, not elsewhere classified: Secondary | ICD-10-CM | POA: Diagnosis not present

## 2015-11-24 DIAGNOSIS — I10 Essential (primary) hypertension: Secondary | ICD-10-CM | POA: Diagnosis not present

## 2015-11-25 DIAGNOSIS — E119 Type 2 diabetes mellitus without complications: Secondary | ICD-10-CM | POA: Diagnosis not present

## 2015-11-25 DIAGNOSIS — R634 Abnormal weight loss: Secondary | ICD-10-CM | POA: Diagnosis not present

## 2015-11-26 DIAGNOSIS — M4854XA Collapsed vertebra, not elsewhere classified, thoracic region, initial encounter for fracture: Secondary | ICD-10-CM | POA: Diagnosis not present

## 2015-11-26 DIAGNOSIS — J9811 Atelectasis: Secondary | ICD-10-CM | POA: Diagnosis not present

## 2015-11-26 DIAGNOSIS — R634 Abnormal weight loss: Secondary | ICD-10-CM | POA: Diagnosis not present

## 2015-11-26 DIAGNOSIS — Z95828 Presence of other vascular implants and grafts: Secondary | ICD-10-CM | POA: Diagnosis not present

## 2015-11-26 LAB — CREATININE, SERUM: CREATININE: 0.59 mg/dL — AB (ref 0.70–1.25)

## 2015-11-26 LAB — HEPATIC FUNCTION PANEL
ALT: 27 U/L (ref 9–46)
AST: 29 U/L (ref 10–35)
Albumin: 4.1 g/dL (ref 3.6–5.1)
Alkaline Phosphatase: 131 U/L — ABNORMAL HIGH (ref 40–115)
BILIRUBIN DIRECT: 0.1 mg/dL (ref <=0.2)
BILIRUBIN TOTAL: 0.4 mg/dL (ref 0.2–1.2)
Indirect Bilirubin: 0.3 mg/dL (ref 0.2–1.2)
Total Protein: 7.1 g/dL (ref 6.1–8.1)

## 2015-11-26 LAB — HEMOGLOBIN A1C
HEMOGLOBIN A1C: 5.3 % (ref <5.7)
Mean Plasma Glucose: 105 mg/dL

## 2015-11-26 LAB — LIPASE: Lipase: 48 U/L (ref 7–60)

## 2015-11-26 LAB — TSH: TSH: 7.49 m[IU]/L — AB (ref 0.40–4.50)

## 2015-11-27 ENCOUNTER — Ambulatory Visit (HOSPITAL_COMMUNITY)
Admission: RE | Admit: 2015-11-27 | Discharge: 2015-11-27 | Disposition: A | Discharge status: Home / Self Care | Payer: Medicare Other | Source: Ambulatory Visit | Attending: Gastroenterology | Admitting: Gastroenterology

## 2015-11-27 DIAGNOSIS — N200 Calculus of kidney: Secondary | ICD-10-CM | POA: Insufficient documentation

## 2015-11-27 DIAGNOSIS — R634 Abnormal weight loss: Secondary | ICD-10-CM | POA: Diagnosis not present

## 2015-11-27 DIAGNOSIS — K8020 Calculus of gallbladder without cholecystitis without obstruction: Secondary | ICD-10-CM | POA: Diagnosis not present

## 2015-11-27 DIAGNOSIS — M438X4 Other specified deforming dorsopathies, thoracic region: Secondary | ICD-10-CM | POA: Insufficient documentation

## 2015-11-27 LAB — POCT I-STAT CREATININE: CREATININE: 0.8 mg/dL (ref 0.61–1.24)

## 2015-11-27 MED ORDER — IOPAMIDOL (ISOVUE-300) INJECTION 61%
100.0000 mL | Freq: Once | INTRAVENOUS | Status: AC | PRN
  Administered 2015-11-27: 100 mL via INTRAVENOUS

## 2015-12-10 NOTE — Progress Notes (Signed)
Mildly elevated alk phos.  TSH elevated. No known h/o thyroid disease.   Please have patient follow up with PCP regarding TSH.  Repeat LFTs in six months.  See CT A/P with contrast result note.

## 2015-12-10 NOTE — Progress Notes (Signed)
Multiple findings on CT but nothing to explain weight loss.  He has large gallstone. Kidney stones in both kidneys. No evidence of stones causing problems.  Interval compression deformity of T12 not seen on exam in 2015.   It appears that the CXR did not get done. He needs to have that done please.

## 2015-12-12 ENCOUNTER — Other Ambulatory Visit: Payer: Self-pay | Admitting: Gastroenterology

## 2015-12-12 DIAGNOSIS — R748 Abnormal levels of other serum enzymes: Secondary | ICD-10-CM

## 2016-01-30 DIAGNOSIS — E1140 Type 2 diabetes mellitus with diabetic neuropathy, unspecified: Secondary | ICD-10-CM | POA: Diagnosis not present

## 2016-01-30 DIAGNOSIS — E1142 Type 2 diabetes mellitus with diabetic polyneuropathy: Secondary | ICD-10-CM | POA: Diagnosis not present

## 2016-02-05 DIAGNOSIS — Z23 Encounter for immunization: Secondary | ICD-10-CM | POA: Diagnosis not present

## 2016-02-11 DIAGNOSIS — Z79899 Other long term (current) drug therapy: Secondary | ICD-10-CM | POA: Diagnosis not present

## 2016-02-11 DIAGNOSIS — E039 Hypothyroidism, unspecified: Secondary | ICD-10-CM | POA: Diagnosis not present

## 2016-02-11 DIAGNOSIS — R5383 Other fatigue: Secondary | ICD-10-CM | POA: Diagnosis not present

## 2016-02-11 DIAGNOSIS — Z125 Encounter for screening for malignant neoplasm of prostate: Secondary | ICD-10-CM | POA: Diagnosis not present

## 2016-05-01 ENCOUNTER — Telehealth: Payer: Self-pay | Admitting: Gastroenterology

## 2016-05-01 NOTE — Telephone Encounter (Signed)
Patient overdue for ov for weight loss.

## 2016-05-04 ENCOUNTER — Encounter: Payer: Self-pay | Admitting: Gastroenterology

## 2016-05-04 NOTE — Telephone Encounter (Signed)
APPT MADE AND LETTER SENT  °

## 2016-05-08 ENCOUNTER — Other Ambulatory Visit: Payer: Self-pay

## 2016-05-08 DIAGNOSIS — R748 Abnormal levels of other serum enzymes: Secondary | ICD-10-CM

## 2016-05-12 ENCOUNTER — Encounter: Payer: Self-pay | Admitting: Internal Medicine

## 2016-05-21 ENCOUNTER — Other Ambulatory Visit: Payer: Self-pay | Admitting: Gastroenterology

## 2016-05-21 DIAGNOSIS — R748 Abnormal levels of other serum enzymes: Secondary | ICD-10-CM | POA: Diagnosis not present

## 2016-05-22 ENCOUNTER — Ambulatory Visit (INDEPENDENT_AMBULATORY_CARE_PROVIDER_SITE_OTHER): Payer: Medicare Other | Admitting: Gastroenterology

## 2016-05-22 ENCOUNTER — Encounter: Payer: Self-pay | Admitting: Gastroenterology

## 2016-05-22 VITALS — BP 109/63 | HR 56 | Temp 97.4°F | Ht 65.0 in | Wt 163.2 lb

## 2016-05-22 DIAGNOSIS — K2100 Gastro-esophageal reflux disease with esophagitis, without bleeding: Secondary | ICD-10-CM

## 2016-05-22 DIAGNOSIS — R634 Abnormal weight loss: Secondary | ICD-10-CM | POA: Diagnosis not present

## 2016-05-22 LAB — HEPATIC FUNCTION PANEL
ALBUMIN: 4.5 g/dL (ref 3.6–4.8)
ALT: 30 IU/L (ref 0–44)
AST: 18 IU/L (ref 0–40)
Alkaline Phosphatase: 144 IU/L — ABNORMAL HIGH (ref 39–117)
BILIRUBIN TOTAL: 0.3 mg/dL (ref 0.0–1.2)
BILIRUBIN, DIRECT: 0.17 mg/dL (ref 0.00–0.40)
TOTAL PROTEIN: 7.3 g/dL (ref 6.0–8.5)

## 2016-05-22 LAB — AMBIG ABBREV HFP7 DEFAULT

## 2016-05-22 NOTE — Progress Notes (Signed)
Primary Care Physician: Monico Blitz, MD  Primary Gastroenterologist:  Garfield Cornea, MD   Chief Complaint  Patient presents with  . Weight Loss    HPI: Gary Richard is a 61 y.o. male here for follow-up of weight loss. He was last seen in August 2017. He has a history of Sturge-Weber syndrome. June 2017, EGD and colonoscopy for hematochezia, weight loss. He had LA grade a esophagitis, gastritis, 2 colonic adenomas removed from the colon. Hemostasis clip was applied to one of the polypectomy sites. Advised to come back for a colonoscopy in 2 years based on pathology. He did undergo chest x-ray and CT abdomen pelvis because of 20% loss (35 pounds) of his body weight over 6 months. CT abdomen and pelvis showed a large gallstone, 2.9 cm but no cholecystitis, bilateral renal stones without hydronephrosis, compression deformity of T12. Nothing to explain weight loss. Chest x-ray also with evidence of lower thoracic deformity compressions, nothing to explain weight loss.   Weight has been stable for past six months. Patient's history unreliable. Caregiver reports patient is eating well. No issues with bowels and no complaints of abdominal pain. She did not bring his medication list but provides list as she can remember. She is not sure about the pantoprazole he used to be on.   Current Outpatient Prescriptions  Medication Sig Dispense Refill  . amLODipine (NORVASC) 5 MG tablet Take 5 mg by mouth daily.    Marland Kitchen aspirin 81 MG tablet Take 81 mg by mouth daily.    Marland Kitchen gemfibrozil (LOPID) 600 MG tablet Take 600 mg by mouth 2 (two) times daily before a meal.    . hydrochlorothiazide (MICROZIDE) 12.5 MG capsule Take 12.5 mg by mouth daily.    . metFORMIN (GLUCOPHAGE-XR) 500 MG 24 hr tablet Take 500 mg by mouth daily with breakfast.    . pravastatin (PRAVACHOL) 10 MG tablet Take 10 mg by mouth daily.     No current facility-administered medications for this visit.     Allergies as of 05/22/2016  .  (No Known Allergies)    ROS: patient unable to provide. No issues noted by caregiver.   General: Negative for anorexia, weight loss, fever, chills, fatigue, weakness. ENT: Negative for hoarseness, difficulty swallowing , nasal congestion. CV: Negative for chest pain, angina, palpitations, dyspnea on exertion, peripheral edema.  Respiratory: Negative for dyspnea at rest, dyspnea on exertion, cough, sputum, wheezing.  GI: See history of present illness. GU:  Negative for dysuria, hematuria, urinary incontinence, urinary frequency, nocturnal urination.  Endo: Negative for unusual weight change.    Physical Examination:   BP 109/63   Pulse (!) 56   Temp 97.4 F (36.3 C) (Oral)   Ht 5\' 5"  (1.651 m)   Wt 163 lb 3.2 oz (74 kg)   BMI 27.16 kg/m   General: Well-nourished, well-developed in no acute distress. 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 Limited verbally due to mental retardation. Eyes: No icterus. Mouth: Oropharyngeal mucosa moist   Abdomen: Bowel sounds are normal, nontender, nondistended,   Extremities: No lower extremity edema. No clubbing or deformities. Neuro: Alert and oriented x person/place   Skin: Warm and dry, no jaundice.   Psych: Alert and cooperative, normal mood and affect.  Labs:  Lab Results  Component Value Date   ALT 27 11/25/2015   AST 29 11/25/2015   ALKPHOS 131 (H) 11/25/2015   BILITOT 0.4 11/25/2015    Imaging Studies: No  results found.

## 2016-05-22 NOTE — Patient Instructions (Signed)
1. We will call and let you know about your lab results once we have received them. Hopefully by the first of the week.  2. Please look over today's medication list that we have, if something is not right please let us know.  3. Continue to monitor his weight. If he drops additional 4-5 pounds, please let us know.  4. Return to the office in one year or sooner if needed.

## 2016-05-22 NOTE — Assessment & Plan Note (Signed)
61 y/o male with Sturge-Weber syndrome who we saw last year for abnormal weight loss of over 35 pounds, (20% of his body weight). EGD showed mild reflux esophagitis/gastritis. CT A/P, chest xray unrevealing as far as source for weight loss. His weight has stabilized over the past six months. Doing well per caregiver.   He had LFTs done yesterday in [place]. Awaiting results. Plan to see him back in one year but they will monitor his weight and if his weight drops more then 4-5 pounds they will let us know. They will also compare the medication list we have with his MAR at home and let us know if any discrepancies.

## 2016-05-25 NOTE — Progress Notes (Signed)
cc'ed to pcp °

## 2016-05-26 NOTE — Progress Notes (Signed)
Alk phos still high.  Please check GGT, AMA, ANA, antismooth muscle antibody.

## 2016-05-28 ENCOUNTER — Other Ambulatory Visit: Payer: Self-pay | Admitting: Gastroenterology

## 2016-05-28 ENCOUNTER — Other Ambulatory Visit: Payer: Self-pay

## 2016-05-28 DIAGNOSIS — R748 Abnormal levels of other serum enzymes: Secondary | ICD-10-CM

## 2016-06-04 DIAGNOSIS — F79 Unspecified intellectual disabilities: Secondary | ICD-10-CM | POA: Diagnosis not present

## 2016-06-04 DIAGNOSIS — Z713 Dietary counseling and surveillance: Secondary | ICD-10-CM | POA: Diagnosis not present

## 2016-06-04 DIAGNOSIS — E781 Pure hyperglyceridemia: Secondary | ICD-10-CM | POA: Diagnosis not present

## 2016-06-04 DIAGNOSIS — I739 Peripheral vascular disease, unspecified: Secondary | ICD-10-CM | POA: Diagnosis not present

## 2016-06-04 DIAGNOSIS — Z299 Encounter for prophylactic measures, unspecified: Secondary | ICD-10-CM | POA: Diagnosis not present

## 2016-06-04 DIAGNOSIS — I10 Essential (primary) hypertension: Secondary | ICD-10-CM | POA: Diagnosis not present

## 2016-06-04 DIAGNOSIS — E669 Obesity, unspecified: Secondary | ICD-10-CM | POA: Diagnosis not present

## 2016-06-04 DIAGNOSIS — E663 Overweight: Secondary | ICD-10-CM | POA: Diagnosis not present

## 2016-06-04 DIAGNOSIS — R569 Unspecified convulsions: Secondary | ICD-10-CM | POA: Diagnosis not present

## 2016-06-04 DIAGNOSIS — E1165 Type 2 diabetes mellitus with hyperglycemia: Secondary | ICD-10-CM | POA: Diagnosis not present

## 2016-06-14 DIAGNOSIS — R748 Abnormal levels of other serum enzymes: Secondary | ICD-10-CM | POA: Diagnosis not present

## 2016-06-15 ENCOUNTER — Telehealth: Payer: Self-pay | Admitting: General Practice

## 2016-06-15 NOTE — Telephone Encounter (Signed)
I received a call from M[place] with [facility] stating that she has taking the patient to the hospital twice to have labs/X-ray done, however the hospital didn't have the order.  I called the hospital and spoke with Lisabeth Pick in the lab and she said I could fax the orders to [phone].  I faxed the orders and confirmed with Magda Paganini at the [facility]) that she does have the orders.  I made Margaretha Sheffield aware and she said she will take him back to [id number] to labs and imaging done

## 2016-06-16 DIAGNOSIS — R0989 Other specified symptoms and signs involving the circulatory and respiratory systems: Secondary | ICD-10-CM | POA: Diagnosis not present

## 2016-06-16 DIAGNOSIS — R634 Abnormal weight loss: Secondary | ICD-10-CM | POA: Diagnosis not present

## 2016-06-16 DIAGNOSIS — I517 Cardiomegaly: Secondary | ICD-10-CM | POA: Diagnosis not present

## 2016-06-16 DIAGNOSIS — R748 Abnormal levels of other serum enzymes: Secondary | ICD-10-CM | POA: Diagnosis not present

## 2016-06-17 NOTE — Telephone Encounter (Signed)
Requested CXR results

## 2016-06-17 NOTE — Telephone Encounter (Signed)
I have gotten these labs from [facility] and they are in your cart.

## 2016-06-17 NOTE — Telephone Encounter (Addendum)
I received one of the four labs I ordered. I have received the GGT (normal at 8). Please verify that the hospital is doing the others, ie AMA, ANA, smooth muscle ab. [facility] is not good about this particularly labs.   I will also need the cxr results.

## 2016-06-17 NOTE — Telephone Encounter (Signed)
Manuela Schwartz, can you retrieve the Chest Xray on Mr. Janek from [facility]

## 2016-06-18 DIAGNOSIS — I739 Peripheral vascular disease, unspecified: Secondary | ICD-10-CM | POA: Diagnosis not present

## 2016-06-18 DIAGNOSIS — B351 Tinea unguium: Secondary | ICD-10-CM | POA: Diagnosis not present

## 2016-06-27 DIAGNOSIS — Q858 Other phakomatoses, not elsewhere classified: Secondary | ICD-10-CM | POA: Diagnosis not present

## 2016-06-27 DIAGNOSIS — K6289 Other specified diseases of anus and rectum: Secondary | ICD-10-CM | POA: Diagnosis not present

## 2016-06-27 DIAGNOSIS — F79 Unspecified intellectual disabilities: Secondary | ICD-10-CM | POA: Diagnosis not present

## 2016-06-27 DIAGNOSIS — G8191 Hemiplegia, unspecified affecting right dominant side: Secondary | ICD-10-CM | POA: Diagnosis not present

## 2016-06-27 DIAGNOSIS — K922 Gastrointestinal hemorrhage, unspecified: Secondary | ICD-10-CM | POA: Diagnosis not present

## 2016-06-27 DIAGNOSIS — Z8673 Personal history of transient ischemic attack (TIA), and cerebral infarction without residual deficits: Secondary | ICD-10-CM | POA: Diagnosis not present

## 2016-06-27 DIAGNOSIS — Z79899 Other long term (current) drug therapy: Secondary | ICD-10-CM | POA: Diagnosis not present

## 2016-06-27 DIAGNOSIS — Z7984 Long term (current) use of oral hypoglycemic drugs: Secondary | ICD-10-CM | POA: Diagnosis not present

## 2016-06-27 DIAGNOSIS — Z7982 Long term (current) use of aspirin: Secondary | ICD-10-CM | POA: Diagnosis not present

## 2016-06-27 DIAGNOSIS — I10 Essential (primary) hypertension: Secondary | ICD-10-CM | POA: Diagnosis not present

## 2016-06-27 DIAGNOSIS — K921 Melena: Secondary | ICD-10-CM | POA: Diagnosis not present

## 2016-06-30 NOTE — Telephone Encounter (Signed)
Received labs from this month. Smooth muscle antibody, ANA, mitochondrial antibody all negative.  Chest x-ray dated 06/16/2016 with cardiomegaly with pulmonary vascular congestion and mild interstitial edema. No pneumonia.   Please send copy of chest x-ray to PCP, recommend patient follow-up with PCP regarding cardiomegaly and mild interstitial edema.  Regarding elevated alkaline phosphatase, repeat LFTs in 6 months.

## 2016-07-07 ENCOUNTER — Other Ambulatory Visit: Payer: Self-pay | Admitting: Gastroenterology

## 2016-07-07 DIAGNOSIS — R748 Abnormal levels of other serum enzymes: Secondary | ICD-10-CM

## 2016-07-07 NOTE — Telephone Encounter (Signed)
Called [facility] and informed them of results. Lab order on file.

## 2016-09-02 DIAGNOSIS — R5383 Other fatigue: Secondary | ICD-10-CM | POA: Diagnosis not present

## 2016-09-02 DIAGNOSIS — Z6827 Body mass index (BMI) 27.0-27.9, adult: Secondary | ICD-10-CM | POA: Diagnosis not present

## 2016-09-02 DIAGNOSIS — Z713 Dietary counseling and surveillance: Secondary | ICD-10-CM | POA: Diagnosis not present

## 2016-09-02 DIAGNOSIS — Z299 Encounter for prophylactic measures, unspecified: Secondary | ICD-10-CM | POA: Diagnosis not present

## 2016-09-02 DIAGNOSIS — R2681 Unsteadiness on feet: Secondary | ICD-10-CM | POA: Diagnosis not present

## 2016-09-02 DIAGNOSIS — I10 Essential (primary) hypertension: Secondary | ICD-10-CM | POA: Diagnosis not present

## 2016-09-02 DIAGNOSIS — E781 Pure hyperglyceridemia: Secondary | ICD-10-CM | POA: Diagnosis not present

## 2016-09-02 DIAGNOSIS — F79 Unspecified intellectual disabilities: Secondary | ICD-10-CM | POA: Diagnosis not present

## 2016-09-02 DIAGNOSIS — E1165 Type 2 diabetes mellitus with hyperglycemia: Secondary | ICD-10-CM | POA: Diagnosis not present

## 2016-09-05 DIAGNOSIS — R2681 Unsteadiness on feet: Secondary | ICD-10-CM | POA: Diagnosis not present

## 2016-09-05 DIAGNOSIS — R569 Unspecified convulsions: Secondary | ICD-10-CM | POA: Diagnosis not present

## 2016-09-05 DIAGNOSIS — E669 Obesity, unspecified: Secondary | ICD-10-CM | POA: Diagnosis not present

## 2016-09-05 DIAGNOSIS — E1165 Type 2 diabetes mellitus with hyperglycemia: Secondary | ICD-10-CM | POA: Diagnosis not present

## 2016-09-05 DIAGNOSIS — G8110 Spastic hemiplegia affecting unspecified side: Secondary | ICD-10-CM | POA: Diagnosis not present

## 2016-09-05 DIAGNOSIS — I10 Essential (primary) hypertension: Secondary | ICD-10-CM | POA: Diagnosis not present

## 2016-09-05 DIAGNOSIS — L84 Corns and callosities: Secondary | ICD-10-CM | POA: Diagnosis not present

## 2016-09-05 DIAGNOSIS — M109 Gout, unspecified: Secondary | ICD-10-CM | POA: Diagnosis not present

## 2016-09-05 DIAGNOSIS — L309 Dermatitis, unspecified: Secondary | ICD-10-CM | POA: Diagnosis not present

## 2016-09-05 DIAGNOSIS — F79 Unspecified intellectual disabilities: Secondary | ICD-10-CM | POA: Diagnosis not present

## 2016-09-05 DIAGNOSIS — E1151 Type 2 diabetes mellitus with diabetic peripheral angiopathy without gangrene: Secondary | ICD-10-CM | POA: Diagnosis not present

## 2016-09-05 DIAGNOSIS — Z6829 Body mass index (BMI) 29.0-29.9, adult: Secondary | ICD-10-CM | POA: Diagnosis not present

## 2016-09-10 DIAGNOSIS — B351 Tinea unguium: Secondary | ICD-10-CM | POA: Diagnosis not present

## 2016-09-10 DIAGNOSIS — I739 Peripheral vascular disease, unspecified: Secondary | ICD-10-CM | POA: Diagnosis not present

## 2016-09-14 DIAGNOSIS — R2681 Unsteadiness on feet: Secondary | ICD-10-CM | POA: Diagnosis not present

## 2016-09-14 DIAGNOSIS — M109 Gout, unspecified: Secondary | ICD-10-CM | POA: Diagnosis not present

## 2016-09-14 DIAGNOSIS — E1165 Type 2 diabetes mellitus with hyperglycemia: Secondary | ICD-10-CM | POA: Diagnosis not present

## 2016-09-14 DIAGNOSIS — E1151 Type 2 diabetes mellitus with diabetic peripheral angiopathy without gangrene: Secondary | ICD-10-CM | POA: Diagnosis not present

## 2016-09-14 DIAGNOSIS — F79 Unspecified intellectual disabilities: Secondary | ICD-10-CM | POA: Diagnosis not present

## 2016-09-14 DIAGNOSIS — G8110 Spastic hemiplegia affecting unspecified side: Secondary | ICD-10-CM | POA: Diagnosis not present

## 2016-09-15 DIAGNOSIS — E039 Hypothyroidism, unspecified: Secondary | ICD-10-CM | POA: Diagnosis not present

## 2016-09-15 DIAGNOSIS — E1165 Type 2 diabetes mellitus with hyperglycemia: Secondary | ICD-10-CM | POA: Diagnosis not present

## 2016-09-15 DIAGNOSIS — R569 Unspecified convulsions: Secondary | ICD-10-CM | POA: Diagnosis not present

## 2016-09-15 DIAGNOSIS — E781 Pure hyperglyceridemia: Secondary | ICD-10-CM | POA: Diagnosis not present

## 2016-09-15 DIAGNOSIS — Z6829 Body mass index (BMI) 29.0-29.9, adult: Secondary | ICD-10-CM | POA: Diagnosis not present

## 2016-09-15 DIAGNOSIS — F79 Unspecified intellectual disabilities: Secondary | ICD-10-CM | POA: Diagnosis not present

## 2016-09-15 DIAGNOSIS — E669 Obesity, unspecified: Secondary | ICD-10-CM | POA: Diagnosis not present

## 2016-09-15 DIAGNOSIS — I10 Essential (primary) hypertension: Secondary | ICD-10-CM | POA: Diagnosis not present

## 2016-09-15 DIAGNOSIS — Z299 Encounter for prophylactic measures, unspecified: Secondary | ICD-10-CM | POA: Diagnosis not present

## 2016-09-15 DIAGNOSIS — I739 Peripheral vascular disease, unspecified: Secondary | ICD-10-CM | POA: Diagnosis not present

## 2016-09-16 DIAGNOSIS — M109 Gout, unspecified: Secondary | ICD-10-CM | POA: Diagnosis not present

## 2016-09-16 DIAGNOSIS — E1165 Type 2 diabetes mellitus with hyperglycemia: Secondary | ICD-10-CM | POA: Diagnosis not present

## 2016-09-16 DIAGNOSIS — R2681 Unsteadiness on feet: Secondary | ICD-10-CM | POA: Diagnosis not present

## 2016-09-16 DIAGNOSIS — F79 Unspecified intellectual disabilities: Secondary | ICD-10-CM | POA: Diagnosis not present

## 2016-09-16 DIAGNOSIS — G8110 Spastic hemiplegia affecting unspecified side: Secondary | ICD-10-CM | POA: Diagnosis not present

## 2016-09-16 DIAGNOSIS — E1151 Type 2 diabetes mellitus with diabetic peripheral angiopathy without gangrene: Secondary | ICD-10-CM | POA: Diagnosis not present

## 2016-09-22 DIAGNOSIS — X58XXXA Exposure to other specified factors, initial encounter: Secondary | ICD-10-CM | POA: Diagnosis not present

## 2016-09-22 DIAGNOSIS — I10 Essential (primary) hypertension: Secondary | ICD-10-CM | POA: Diagnosis not present

## 2016-09-22 DIAGNOSIS — Z79899 Other long term (current) drug therapy: Secondary | ICD-10-CM | POA: Diagnosis not present

## 2016-09-22 DIAGNOSIS — I69351 Hemiplegia and hemiparesis following cerebral infarction affecting right dominant side: Secondary | ICD-10-CM | POA: Diagnosis not present

## 2016-09-22 DIAGNOSIS — Z7984 Long term (current) use of oral hypoglycemic drugs: Secondary | ICD-10-CM | POA: Diagnosis not present

## 2016-09-22 DIAGNOSIS — S0181XA Laceration without foreign body of other part of head, initial encounter: Secondary | ICD-10-CM | POA: Diagnosis not present

## 2016-09-22 DIAGNOSIS — Z7982 Long term (current) use of aspirin: Secondary | ICD-10-CM | POA: Diagnosis not present

## 2016-09-23 DIAGNOSIS — R2681 Unsteadiness on feet: Secondary | ICD-10-CM | POA: Diagnosis not present

## 2016-09-23 DIAGNOSIS — E1165 Type 2 diabetes mellitus with hyperglycemia: Secondary | ICD-10-CM | POA: Diagnosis not present

## 2016-09-23 DIAGNOSIS — G8110 Spastic hemiplegia affecting unspecified side: Secondary | ICD-10-CM | POA: Diagnosis not present

## 2016-09-23 DIAGNOSIS — E1151 Type 2 diabetes mellitus with diabetic peripheral angiopathy without gangrene: Secondary | ICD-10-CM | POA: Diagnosis not present

## 2016-09-23 DIAGNOSIS — F79 Unspecified intellectual disabilities: Secondary | ICD-10-CM | POA: Diagnosis not present

## 2016-09-23 DIAGNOSIS — M109 Gout, unspecified: Secondary | ICD-10-CM | POA: Diagnosis not present

## 2016-09-24 DIAGNOSIS — Z7984 Long term (current) use of oral hypoglycemic drugs: Secondary | ICD-10-CM | POA: Diagnosis not present

## 2016-09-24 DIAGNOSIS — M7731 Calcaneal spur, right foot: Secondary | ICD-10-CM | POA: Diagnosis not present

## 2016-09-24 DIAGNOSIS — M7989 Other specified soft tissue disorders: Secondary | ICD-10-CM | POA: Diagnosis not present

## 2016-09-24 DIAGNOSIS — Z79899 Other long term (current) drug therapy: Secondary | ICD-10-CM | POA: Diagnosis not present

## 2016-09-24 DIAGNOSIS — F79 Unspecified intellectual disabilities: Secondary | ICD-10-CM | POA: Diagnosis not present

## 2016-09-24 DIAGNOSIS — Z7982 Long term (current) use of aspirin: Secondary | ICD-10-CM | POA: Diagnosis not present

## 2016-09-24 DIAGNOSIS — Z8673 Personal history of transient ischemic attack (TIA), and cerebral infarction without residual deficits: Secondary | ICD-10-CM | POA: Diagnosis not present

## 2016-09-24 DIAGNOSIS — I10 Essential (primary) hypertension: Secondary | ICD-10-CM | POA: Diagnosis not present

## 2016-09-28 DIAGNOSIS — F79 Unspecified intellectual disabilities: Secondary | ICD-10-CM | POA: Diagnosis not present

## 2016-09-28 DIAGNOSIS — R2681 Unsteadiness on feet: Secondary | ICD-10-CM | POA: Diagnosis not present

## 2016-09-28 DIAGNOSIS — M109 Gout, unspecified: Secondary | ICD-10-CM | POA: Diagnosis not present

## 2016-09-28 DIAGNOSIS — G8110 Spastic hemiplegia affecting unspecified side: Secondary | ICD-10-CM | POA: Diagnosis not present

## 2016-09-28 DIAGNOSIS — E1151 Type 2 diabetes mellitus with diabetic peripheral angiopathy without gangrene: Secondary | ICD-10-CM | POA: Diagnosis not present

## 2016-09-28 DIAGNOSIS — E1165 Type 2 diabetes mellitus with hyperglycemia: Secondary | ICD-10-CM | POA: Diagnosis not present

## 2016-09-29 DIAGNOSIS — E119 Type 2 diabetes mellitus without complications: Secondary | ICD-10-CM | POA: Diagnosis not present

## 2016-09-29 DIAGNOSIS — I739 Peripheral vascular disease, unspecified: Secondary | ICD-10-CM | POA: Diagnosis not present

## 2016-09-29 DIAGNOSIS — G9341 Metabolic encephalopathy: Secondary | ICD-10-CM | POA: Diagnosis not present

## 2016-09-29 DIAGNOSIS — M109 Gout, unspecified: Secondary | ICD-10-CM | POA: Diagnosis not present

## 2016-09-29 DIAGNOSIS — Z79899 Other long term (current) drug therapy: Secondary | ICD-10-CM | POA: Diagnosis not present

## 2016-09-29 DIAGNOSIS — Q858 Other phakomatoses, not elsewhere classified: Secondary | ICD-10-CM | POA: Diagnosis not present

## 2016-09-29 DIAGNOSIS — F79 Unspecified intellectual disabilities: Secondary | ICD-10-CM | POA: Diagnosis not present

## 2016-09-29 DIAGNOSIS — I10 Essential (primary) hypertension: Secondary | ICD-10-CM | POA: Diagnosis not present

## 2016-09-29 DIAGNOSIS — R4182 Altered mental status, unspecified: Secondary | ICD-10-CM | POA: Diagnosis not present

## 2016-09-29 DIAGNOSIS — Z7984 Long term (current) use of oral hypoglycemic drugs: Secondary | ICD-10-CM | POA: Diagnosis not present

## 2016-09-29 DIAGNOSIS — M6281 Muscle weakness (generalized): Secondary | ICD-10-CM | POA: Diagnosis not present

## 2016-09-29 DIAGNOSIS — R569 Unspecified convulsions: Secondary | ICD-10-CM | POA: Diagnosis not present

## 2016-09-29 DIAGNOSIS — Z7982 Long term (current) use of aspirin: Secondary | ICD-10-CM | POA: Diagnosis not present

## 2016-09-29 DIAGNOSIS — Z888 Allergy status to other drugs, medicaments and biological substances status: Secondary | ICD-10-CM | POA: Diagnosis not present

## 2016-09-29 DIAGNOSIS — E781 Pure hyperglyceridemia: Secondary | ICD-10-CM | POA: Diagnosis not present

## 2016-09-29 DIAGNOSIS — E039 Hypothyroidism, unspecified: Secondary | ICD-10-CM | POA: Diagnosis not present

## 2016-09-30 DIAGNOSIS — E119 Type 2 diabetes mellitus without complications: Secondary | ICD-10-CM | POA: Diagnosis not present

## 2016-09-30 DIAGNOSIS — M6281 Muscle weakness (generalized): Secondary | ICD-10-CM | POA: Diagnosis not present

## 2016-09-30 DIAGNOSIS — R569 Unspecified convulsions: Secondary | ICD-10-CM | POA: Diagnosis not present

## 2016-09-30 DIAGNOSIS — G9341 Metabolic encephalopathy: Secondary | ICD-10-CM | POA: Diagnosis not present

## 2016-09-30 DIAGNOSIS — R4182 Altered mental status, unspecified: Secondary | ICD-10-CM | POA: Diagnosis not present

## 2016-09-30 DIAGNOSIS — F79 Unspecified intellectual disabilities: Secondary | ICD-10-CM | POA: Diagnosis not present

## 2016-10-01 DIAGNOSIS — R4182 Altered mental status, unspecified: Secondary | ICD-10-CM | POA: Diagnosis not present

## 2016-10-01 DIAGNOSIS — E119 Type 2 diabetes mellitus without complications: Secondary | ICD-10-CM | POA: Diagnosis not present

## 2016-10-01 DIAGNOSIS — F79 Unspecified intellectual disabilities: Secondary | ICD-10-CM | POA: Diagnosis not present

## 2016-10-01 DIAGNOSIS — G9341 Metabolic encephalopathy: Secondary | ICD-10-CM | POA: Diagnosis not present

## 2016-10-01 DIAGNOSIS — R569 Unspecified convulsions: Secondary | ICD-10-CM | POA: Diagnosis not present

## 2016-10-01 DIAGNOSIS — M6281 Muscle weakness (generalized): Secondary | ICD-10-CM | POA: Diagnosis not present

## 2016-10-02 DIAGNOSIS — M6281 Muscle weakness (generalized): Secondary | ICD-10-CM | POA: Diagnosis not present

## 2016-10-02 DIAGNOSIS — E119 Type 2 diabetes mellitus without complications: Secondary | ICD-10-CM | POA: Diagnosis not present

## 2016-10-02 DIAGNOSIS — F79 Unspecified intellectual disabilities: Secondary | ICD-10-CM | POA: Diagnosis not present

## 2016-10-02 DIAGNOSIS — R569 Unspecified convulsions: Secondary | ICD-10-CM | POA: Diagnosis not present

## 2016-10-02 DIAGNOSIS — G9341 Metabolic encephalopathy: Secondary | ICD-10-CM | POA: Diagnosis not present

## 2016-10-02 DIAGNOSIS — R4182 Altered mental status, unspecified: Secondary | ICD-10-CM | POA: Diagnosis not present

## 2016-10-03 DIAGNOSIS — E119 Type 2 diabetes mellitus without complications: Secondary | ICD-10-CM | POA: Diagnosis not present

## 2016-10-03 DIAGNOSIS — M6281 Muscle weakness (generalized): Secondary | ICD-10-CM | POA: Diagnosis not present

## 2016-10-03 DIAGNOSIS — R569 Unspecified convulsions: Secondary | ICD-10-CM | POA: Diagnosis not present

## 2016-10-03 DIAGNOSIS — R4182 Altered mental status, unspecified: Secondary | ICD-10-CM | POA: Diagnosis not present

## 2016-10-03 DIAGNOSIS — F79 Unspecified intellectual disabilities: Secondary | ICD-10-CM | POA: Diagnosis not present

## 2016-10-03 DIAGNOSIS — G9341 Metabolic encephalopathy: Secondary | ICD-10-CM | POA: Diagnosis not present

## 2016-10-04 DIAGNOSIS — R4182 Altered mental status, unspecified: Secondary | ICD-10-CM | POA: Diagnosis not present

## 2016-10-04 DIAGNOSIS — E119 Type 2 diabetes mellitus without complications: Secondary | ICD-10-CM | POA: Diagnosis not present

## 2016-10-04 DIAGNOSIS — F79 Unspecified intellectual disabilities: Secondary | ICD-10-CM | POA: Diagnosis not present

## 2016-10-04 DIAGNOSIS — M6281 Muscle weakness (generalized): Secondary | ICD-10-CM | POA: Diagnosis not present

## 2016-10-04 DIAGNOSIS — G9341 Metabolic encephalopathy: Secondary | ICD-10-CM | POA: Diagnosis not present

## 2016-10-04 DIAGNOSIS — R569 Unspecified convulsions: Secondary | ICD-10-CM | POA: Diagnosis not present

## 2016-10-05 DIAGNOSIS — F79 Unspecified intellectual disabilities: Secondary | ICD-10-CM | POA: Diagnosis not present

## 2016-10-05 DIAGNOSIS — R569 Unspecified convulsions: Secondary | ICD-10-CM | POA: Diagnosis not present

## 2016-10-05 DIAGNOSIS — R4182 Altered mental status, unspecified: Secondary | ICD-10-CM | POA: Diagnosis not present

## 2016-10-05 DIAGNOSIS — E119 Type 2 diabetes mellitus without complications: Secondary | ICD-10-CM | POA: Diagnosis not present

## 2016-10-05 DIAGNOSIS — G9341 Metabolic encephalopathy: Secondary | ICD-10-CM | POA: Diagnosis not present

## 2016-10-05 DIAGNOSIS — M6281 Muscle weakness (generalized): Secondary | ICD-10-CM | POA: Diagnosis not present

## 2016-10-06 DIAGNOSIS — F79 Unspecified intellectual disabilities: Secondary | ICD-10-CM | POA: Diagnosis not present

## 2016-10-06 DIAGNOSIS — R569 Unspecified convulsions: Secondary | ICD-10-CM | POA: Diagnosis not present

## 2016-10-06 DIAGNOSIS — E119 Type 2 diabetes mellitus without complications: Secondary | ICD-10-CM | POA: Diagnosis not present

## 2016-10-06 DIAGNOSIS — M6281 Muscle weakness (generalized): Secondary | ICD-10-CM | POA: Diagnosis not present

## 2016-10-06 DIAGNOSIS — G9341 Metabolic encephalopathy: Secondary | ICD-10-CM | POA: Diagnosis not present

## 2016-10-06 DIAGNOSIS — R4182 Altered mental status, unspecified: Secondary | ICD-10-CM | POA: Diagnosis not present

## 2016-10-07 DIAGNOSIS — G9341 Metabolic encephalopathy: Secondary | ICD-10-CM | POA: Diagnosis not present

## 2016-10-07 DIAGNOSIS — F79 Unspecified intellectual disabilities: Secondary | ICD-10-CM | POA: Diagnosis not present

## 2016-10-07 DIAGNOSIS — R4182 Altered mental status, unspecified: Secondary | ICD-10-CM | POA: Diagnosis not present

## 2016-10-07 DIAGNOSIS — R569 Unspecified convulsions: Secondary | ICD-10-CM | POA: Diagnosis not present

## 2016-10-07 DIAGNOSIS — E119 Type 2 diabetes mellitus without complications: Secondary | ICD-10-CM | POA: Diagnosis not present

## 2016-10-07 DIAGNOSIS — M6281 Muscle weakness (generalized): Secondary | ICD-10-CM | POA: Diagnosis not present

## 2016-10-08 DIAGNOSIS — E119 Type 2 diabetes mellitus without complications: Secondary | ICD-10-CM | POA: Diagnosis not present

## 2016-10-08 DIAGNOSIS — R569 Unspecified convulsions: Secondary | ICD-10-CM | POA: Diagnosis not present

## 2016-10-08 DIAGNOSIS — F79 Unspecified intellectual disabilities: Secondary | ICD-10-CM | POA: Diagnosis not present

## 2016-10-08 DIAGNOSIS — G9341 Metabolic encephalopathy: Secondary | ICD-10-CM | POA: Diagnosis not present

## 2016-10-08 DIAGNOSIS — R4182 Altered mental status, unspecified: Secondary | ICD-10-CM | POA: Diagnosis not present

## 2016-10-08 DIAGNOSIS — M6281 Muscle weakness (generalized): Secondary | ICD-10-CM | POA: Diagnosis not present

## 2016-10-09 DIAGNOSIS — R569 Unspecified convulsions: Secondary | ICD-10-CM | POA: Diagnosis not present

## 2016-10-09 DIAGNOSIS — M6281 Muscle weakness (generalized): Secondary | ICD-10-CM | POA: Diagnosis not present

## 2016-10-09 DIAGNOSIS — F79 Unspecified intellectual disabilities: Secondary | ICD-10-CM | POA: Diagnosis not present

## 2016-10-09 DIAGNOSIS — E119 Type 2 diabetes mellitus without complications: Secondary | ICD-10-CM | POA: Diagnosis not present

## 2016-10-09 DIAGNOSIS — G9341 Metabolic encephalopathy: Secondary | ICD-10-CM | POA: Diagnosis not present

## 2016-10-09 DIAGNOSIS — R4182 Altered mental status, unspecified: Secondary | ICD-10-CM | POA: Diagnosis not present

## 2016-10-10 DIAGNOSIS — R4182 Altered mental status, unspecified: Secondary | ICD-10-CM | POA: Diagnosis not present

## 2016-10-10 DIAGNOSIS — E119 Type 2 diabetes mellitus without complications: Secondary | ICD-10-CM | POA: Diagnosis not present

## 2016-10-10 DIAGNOSIS — M6281 Muscle weakness (generalized): Secondary | ICD-10-CM | POA: Diagnosis not present

## 2016-10-10 DIAGNOSIS — G9341 Metabolic encephalopathy: Secondary | ICD-10-CM | POA: Diagnosis not present

## 2016-10-10 DIAGNOSIS — R569 Unspecified convulsions: Secondary | ICD-10-CM | POA: Diagnosis not present

## 2016-10-10 DIAGNOSIS — F79 Unspecified intellectual disabilities: Secondary | ICD-10-CM | POA: Diagnosis not present

## 2016-10-11 DIAGNOSIS — I10 Essential (primary) hypertension: Secondary | ICD-10-CM | POA: Diagnosis not present

## 2016-10-11 DIAGNOSIS — R4182 Altered mental status, unspecified: Secondary | ICD-10-CM | POA: Diagnosis not present

## 2016-10-11 DIAGNOSIS — Z7982 Long term (current) use of aspirin: Secondary | ICD-10-CM | POA: Diagnosis not present

## 2016-10-11 DIAGNOSIS — F79 Unspecified intellectual disabilities: Secondary | ICD-10-CM | POA: Diagnosis not present

## 2016-10-11 DIAGNOSIS — E119 Type 2 diabetes mellitus without complications: Secondary | ICD-10-CM | POA: Diagnosis not present

## 2016-10-11 DIAGNOSIS — G9341 Metabolic encephalopathy: Secondary | ICD-10-CM | POA: Diagnosis not present

## 2016-10-11 DIAGNOSIS — Z7984 Long term (current) use of oral hypoglycemic drugs: Secondary | ICD-10-CM | POA: Diagnosis not present

## 2016-10-11 DIAGNOSIS — M6281 Muscle weakness (generalized): Secondary | ICD-10-CM | POA: Diagnosis not present

## 2016-10-11 DIAGNOSIS — E039 Hypothyroidism, unspecified: Secondary | ICD-10-CM | POA: Diagnosis not present

## 2016-10-11 DIAGNOSIS — Z79899 Other long term (current) drug therapy: Secondary | ICD-10-CM | POA: Diagnosis not present

## 2016-10-11 DIAGNOSIS — E781 Pure hyperglyceridemia: Secondary | ICD-10-CM | POA: Diagnosis not present

## 2016-10-11 DIAGNOSIS — M109 Gout, unspecified: Secondary | ICD-10-CM | POA: Diagnosis not present

## 2016-10-11 DIAGNOSIS — I739 Peripheral vascular disease, unspecified: Secondary | ICD-10-CM | POA: Diagnosis not present

## 2016-10-11 DIAGNOSIS — R569 Unspecified convulsions: Secondary | ICD-10-CM | POA: Diagnosis not present

## 2016-10-11 DIAGNOSIS — Z888 Allergy status to other drugs, medicaments and biological substances status: Secondary | ICD-10-CM | POA: Diagnosis not present

## 2016-10-11 DIAGNOSIS — Q858 Other phakomatoses, not elsewhere classified: Secondary | ICD-10-CM | POA: Diagnosis not present

## 2016-10-12 DIAGNOSIS — R4182 Altered mental status, unspecified: Secondary | ICD-10-CM | POA: Diagnosis not present

## 2016-10-12 DIAGNOSIS — F79 Unspecified intellectual disabilities: Secondary | ICD-10-CM | POA: Diagnosis not present

## 2016-10-12 DIAGNOSIS — M6281 Muscle weakness (generalized): Secondary | ICD-10-CM | POA: Diagnosis not present

## 2016-10-12 DIAGNOSIS — R569 Unspecified convulsions: Secondary | ICD-10-CM | POA: Diagnosis not present

## 2016-10-12 DIAGNOSIS — E119 Type 2 diabetes mellitus without complications: Secondary | ICD-10-CM | POA: Diagnosis not present

## 2016-10-12 DIAGNOSIS — G9341 Metabolic encephalopathy: Secondary | ICD-10-CM | POA: Diagnosis not present

## 2016-10-13 DIAGNOSIS — F79 Unspecified intellectual disabilities: Secondary | ICD-10-CM | POA: Diagnosis not present

## 2016-10-13 DIAGNOSIS — M6281 Muscle weakness (generalized): Secondary | ICD-10-CM | POA: Diagnosis not present

## 2016-10-13 DIAGNOSIS — E119 Type 2 diabetes mellitus without complications: Secondary | ICD-10-CM | POA: Diagnosis not present

## 2016-10-13 DIAGNOSIS — R569 Unspecified convulsions: Secondary | ICD-10-CM | POA: Diagnosis not present

## 2016-10-13 DIAGNOSIS — R4182 Altered mental status, unspecified: Secondary | ICD-10-CM | POA: Diagnosis not present

## 2016-10-13 DIAGNOSIS — G9341 Metabolic encephalopathy: Secondary | ICD-10-CM | POA: Diagnosis not present

## 2016-10-14 DIAGNOSIS — R4182 Altered mental status, unspecified: Secondary | ICD-10-CM | POA: Diagnosis not present

## 2016-10-14 DIAGNOSIS — F79 Unspecified intellectual disabilities: Secondary | ICD-10-CM | POA: Diagnosis not present

## 2016-10-14 DIAGNOSIS — M6281 Muscle weakness (generalized): Secondary | ICD-10-CM | POA: Diagnosis not present

## 2016-10-14 DIAGNOSIS — R569 Unspecified convulsions: Secondary | ICD-10-CM | POA: Diagnosis not present

## 2016-10-14 DIAGNOSIS — E119 Type 2 diabetes mellitus without complications: Secondary | ICD-10-CM | POA: Diagnosis not present

## 2016-10-14 DIAGNOSIS — G9341 Metabolic encephalopathy: Secondary | ICD-10-CM | POA: Diagnosis not present

## 2016-10-15 DIAGNOSIS — R4182 Altered mental status, unspecified: Secondary | ICD-10-CM | POA: Diagnosis not present

## 2016-10-15 DIAGNOSIS — R569 Unspecified convulsions: Secondary | ICD-10-CM | POA: Diagnosis not present

## 2016-10-15 DIAGNOSIS — M6281 Muscle weakness (generalized): Secondary | ICD-10-CM | POA: Diagnosis not present

## 2016-10-15 DIAGNOSIS — G9341 Metabolic encephalopathy: Secondary | ICD-10-CM | POA: Diagnosis not present

## 2016-10-15 DIAGNOSIS — F79 Unspecified intellectual disabilities: Secondary | ICD-10-CM | POA: Diagnosis not present

## 2016-10-15 DIAGNOSIS — E119 Type 2 diabetes mellitus without complications: Secondary | ICD-10-CM | POA: Diagnosis not present

## 2016-10-16 DIAGNOSIS — G40909 Epilepsy, unspecified, not intractable, without status epilepticus: Secondary | ICD-10-CM | POA: Diagnosis not present

## 2016-10-16 DIAGNOSIS — R41841 Cognitive communication deficit: Secondary | ICD-10-CM | POA: Diagnosis not present

## 2016-10-16 DIAGNOSIS — M109 Gout, unspecified: Secondary | ICD-10-CM | POA: Diagnosis not present

## 2016-10-16 DIAGNOSIS — R1312 Dysphagia, oropharyngeal phase: Secondary | ICD-10-CM | POA: Diagnosis not present

## 2016-10-18 DIAGNOSIS — M109 Gout, unspecified: Secondary | ICD-10-CM | POA: Diagnosis not present

## 2016-10-18 DIAGNOSIS — R1312 Dysphagia, oropharyngeal phase: Secondary | ICD-10-CM | POA: Diagnosis not present

## 2016-10-18 DIAGNOSIS — R41841 Cognitive communication deficit: Secondary | ICD-10-CM | POA: Diagnosis not present

## 2016-10-18 DIAGNOSIS — G40909 Epilepsy, unspecified, not intractable, without status epilepticus: Secondary | ICD-10-CM | POA: Diagnosis not present

## 2016-10-19 DIAGNOSIS — R41841 Cognitive communication deficit: Secondary | ICD-10-CM | POA: Diagnosis not present

## 2016-10-19 DIAGNOSIS — G40909 Epilepsy, unspecified, not intractable, without status epilepticus: Secondary | ICD-10-CM | POA: Diagnosis not present

## 2016-10-19 DIAGNOSIS — M109 Gout, unspecified: Secondary | ICD-10-CM | POA: Diagnosis not present

## 2016-10-19 DIAGNOSIS — R1312 Dysphagia, oropharyngeal phase: Secondary | ICD-10-CM | POA: Diagnosis not present

## 2016-10-20 DIAGNOSIS — M6281 Muscle weakness (generalized): Secondary | ICD-10-CM | POA: Diagnosis not present

## 2016-10-20 DIAGNOSIS — M109 Gout, unspecified: Secondary | ICD-10-CM | POA: Diagnosis not present

## 2016-10-20 DIAGNOSIS — G40909 Epilepsy, unspecified, not intractable, without status epilepticus: Secondary | ICD-10-CM | POA: Diagnosis not present

## 2016-10-20 DIAGNOSIS — R1312 Dysphagia, oropharyngeal phase: Secondary | ICD-10-CM | POA: Diagnosis not present

## 2016-10-20 DIAGNOSIS — R41841 Cognitive communication deficit: Secondary | ICD-10-CM | POA: Diagnosis not present

## 2016-10-20 DIAGNOSIS — K219 Gastro-esophageal reflux disease without esophagitis: Secondary | ICD-10-CM | POA: Diagnosis not present

## 2016-10-21 DIAGNOSIS — M109 Gout, unspecified: Secondary | ICD-10-CM | POA: Diagnosis not present

## 2016-10-21 DIAGNOSIS — R41841 Cognitive communication deficit: Secondary | ICD-10-CM | POA: Diagnosis not present

## 2016-10-21 DIAGNOSIS — R1312 Dysphagia, oropharyngeal phase: Secondary | ICD-10-CM | POA: Diagnosis not present

## 2016-10-21 DIAGNOSIS — G40909 Epilepsy, unspecified, not intractable, without status epilepticus: Secondary | ICD-10-CM | POA: Diagnosis not present

## 2016-10-22 DIAGNOSIS — M109 Gout, unspecified: Secondary | ICD-10-CM | POA: Diagnosis not present

## 2016-10-22 DIAGNOSIS — R1312 Dysphagia, oropharyngeal phase: Secondary | ICD-10-CM | POA: Diagnosis not present

## 2016-10-22 DIAGNOSIS — R41841 Cognitive communication deficit: Secondary | ICD-10-CM | POA: Diagnosis not present

## 2016-10-22 DIAGNOSIS — G40909 Epilepsy, unspecified, not intractable, without status epilepticus: Secondary | ICD-10-CM | POA: Diagnosis not present

## 2016-10-23 DIAGNOSIS — M109 Gout, unspecified: Secondary | ICD-10-CM | POA: Diagnosis not present

## 2016-10-23 DIAGNOSIS — R41841 Cognitive communication deficit: Secondary | ICD-10-CM | POA: Diagnosis not present

## 2016-10-23 DIAGNOSIS — G40909 Epilepsy, unspecified, not intractable, without status epilepticus: Secondary | ICD-10-CM | POA: Diagnosis not present

## 2016-10-23 DIAGNOSIS — R1312 Dysphagia, oropharyngeal phase: Secondary | ICD-10-CM | POA: Diagnosis not present

## 2016-10-26 DIAGNOSIS — R41841 Cognitive communication deficit: Secondary | ICD-10-CM | POA: Diagnosis not present

## 2016-10-26 DIAGNOSIS — G40909 Epilepsy, unspecified, not intractable, without status epilepticus: Secondary | ICD-10-CM | POA: Diagnosis not present

## 2016-10-26 DIAGNOSIS — M109 Gout, unspecified: Secondary | ICD-10-CM | POA: Diagnosis not present

## 2016-10-26 DIAGNOSIS — R1312 Dysphagia, oropharyngeal phase: Secondary | ICD-10-CM | POA: Diagnosis not present

## 2016-10-27 DIAGNOSIS — R1312 Dysphagia, oropharyngeal phase: Secondary | ICD-10-CM | POA: Diagnosis not present

## 2016-10-27 DIAGNOSIS — M109 Gout, unspecified: Secondary | ICD-10-CM | POA: Diagnosis not present

## 2016-10-27 DIAGNOSIS — R41841 Cognitive communication deficit: Secondary | ICD-10-CM | POA: Diagnosis not present

## 2016-10-27 DIAGNOSIS — G40909 Epilepsy, unspecified, not intractable, without status epilepticus: Secondary | ICD-10-CM | POA: Diagnosis not present

## 2016-10-28 DIAGNOSIS — M109 Gout, unspecified: Secondary | ICD-10-CM | POA: Diagnosis not present

## 2016-10-28 DIAGNOSIS — R41841 Cognitive communication deficit: Secondary | ICD-10-CM | POA: Diagnosis not present

## 2016-10-28 DIAGNOSIS — R1312 Dysphagia, oropharyngeal phase: Secondary | ICD-10-CM | POA: Diagnosis not present

## 2016-10-28 DIAGNOSIS — G40909 Epilepsy, unspecified, not intractable, without status epilepticus: Secondary | ICD-10-CM | POA: Diagnosis not present

## 2016-10-29 DIAGNOSIS — G40909 Epilepsy, unspecified, not intractable, without status epilepticus: Secondary | ICD-10-CM | POA: Diagnosis not present

## 2016-10-29 DIAGNOSIS — M109 Gout, unspecified: Secondary | ICD-10-CM | POA: Diagnosis not present

## 2016-10-29 DIAGNOSIS — R41841 Cognitive communication deficit: Secondary | ICD-10-CM | POA: Diagnosis not present

## 2016-10-29 DIAGNOSIS — R1312 Dysphagia, oropharyngeal phase: Secondary | ICD-10-CM | POA: Diagnosis not present

## 2016-10-30 DIAGNOSIS — R1312 Dysphagia, oropharyngeal phase: Secondary | ICD-10-CM | POA: Diagnosis not present

## 2016-10-30 DIAGNOSIS — M109 Gout, unspecified: Secondary | ICD-10-CM | POA: Diagnosis not present

## 2016-10-30 DIAGNOSIS — R41841 Cognitive communication deficit: Secondary | ICD-10-CM | POA: Diagnosis not present

## 2016-10-30 DIAGNOSIS — G40909 Epilepsy, unspecified, not intractable, without status epilepticus: Secondary | ICD-10-CM | POA: Diagnosis not present

## 2016-11-02 DIAGNOSIS — M109 Gout, unspecified: Secondary | ICD-10-CM | POA: Diagnosis not present

## 2016-11-02 DIAGNOSIS — G40909 Epilepsy, unspecified, not intractable, without status epilepticus: Secondary | ICD-10-CM | POA: Diagnosis not present

## 2016-11-02 DIAGNOSIS — R1312 Dysphagia, oropharyngeal phase: Secondary | ICD-10-CM | POA: Diagnosis not present

## 2016-11-02 DIAGNOSIS — R41841 Cognitive communication deficit: Secondary | ICD-10-CM | POA: Diagnosis not present

## 2016-11-03 DIAGNOSIS — R41841 Cognitive communication deficit: Secondary | ICD-10-CM | POA: Diagnosis not present

## 2016-11-03 DIAGNOSIS — M109 Gout, unspecified: Secondary | ICD-10-CM | POA: Diagnosis not present

## 2016-11-03 DIAGNOSIS — R1312 Dysphagia, oropharyngeal phase: Secondary | ICD-10-CM | POA: Diagnosis not present

## 2016-11-03 DIAGNOSIS — G40909 Epilepsy, unspecified, not intractable, without status epilepticus: Secondary | ICD-10-CM | POA: Diagnosis not present

## 2016-11-04 DIAGNOSIS — R41841 Cognitive communication deficit: Secondary | ICD-10-CM | POA: Diagnosis not present

## 2016-11-04 DIAGNOSIS — G40909 Epilepsy, unspecified, not intractable, without status epilepticus: Secondary | ICD-10-CM | POA: Diagnosis not present

## 2016-11-04 DIAGNOSIS — M109 Gout, unspecified: Secondary | ICD-10-CM | POA: Diagnosis not present

## 2016-11-04 DIAGNOSIS — R1312 Dysphagia, oropharyngeal phase: Secondary | ICD-10-CM | POA: Diagnosis not present

## 2016-11-05 DIAGNOSIS — R41841 Cognitive communication deficit: Secondary | ICD-10-CM | POA: Diagnosis not present

## 2016-11-05 DIAGNOSIS — G40909 Epilepsy, unspecified, not intractable, without status epilepticus: Secondary | ICD-10-CM | POA: Diagnosis not present

## 2016-11-05 DIAGNOSIS — R1312 Dysphagia, oropharyngeal phase: Secondary | ICD-10-CM | POA: Diagnosis not present

## 2016-11-05 DIAGNOSIS — M109 Gout, unspecified: Secondary | ICD-10-CM | POA: Diagnosis not present

## 2016-11-05 DIAGNOSIS — D649 Anemia, unspecified: Secondary | ICD-10-CM | POA: Diagnosis not present

## 2016-11-05 DIAGNOSIS — Z79899 Other long term (current) drug therapy: Secondary | ICD-10-CM | POA: Diagnosis not present

## 2016-11-06 DIAGNOSIS — R1312 Dysphagia, oropharyngeal phase: Secondary | ICD-10-CM | POA: Diagnosis not present

## 2016-11-06 DIAGNOSIS — G40909 Epilepsy, unspecified, not intractable, without status epilepticus: Secondary | ICD-10-CM | POA: Diagnosis not present

## 2016-11-06 DIAGNOSIS — M109 Gout, unspecified: Secondary | ICD-10-CM | POA: Diagnosis not present

## 2016-11-06 DIAGNOSIS — R41841 Cognitive communication deficit: Secondary | ICD-10-CM | POA: Diagnosis not present

## 2016-11-08 ENCOUNTER — Other Ambulatory Visit
Admission: RE | Admit: 2016-11-08 | Discharge: 2016-11-08 | Disposition: A | Discharge status: Home / Self Care | Payer: Medicare Other | Source: Ambulatory Visit | Attending: *Deleted | Admitting: *Deleted

## 2016-11-08 DIAGNOSIS — D649 Anemia, unspecified: Secondary | ICD-10-CM | POA: Insufficient documentation

## 2016-11-08 DIAGNOSIS — R41841 Cognitive communication deficit: Secondary | ICD-10-CM | POA: Diagnosis not present

## 2016-11-08 DIAGNOSIS — R1312 Dysphagia, oropharyngeal phase: Secondary | ICD-10-CM | POA: Diagnosis not present

## 2016-11-08 DIAGNOSIS — M109 Gout, unspecified: Secondary | ICD-10-CM | POA: Diagnosis not present

## 2016-11-08 DIAGNOSIS — G40909 Epilepsy, unspecified, not intractable, without status epilepticus: Secondary | ICD-10-CM | POA: Diagnosis not present

## 2016-11-08 LAB — CBC WITH DIFFERENTIAL/PLATELET
BASOS ABS: 0 10*3/uL (ref 0–0.1)
BASOS PCT: 1 %
Eosinophils Absolute: 0.6 10*3/uL (ref 0–0.7)
Eosinophils Relative: 10 %
HEMATOCRIT: 39.3 % — AB (ref 40.0–52.0)
HEMOGLOBIN: 13.2 g/dL (ref 13.0–18.0)
Lymphocytes Relative: 26 %
Lymphs Abs: 1.5 10*3/uL (ref 1.0–3.6)
MCH: 31.5 pg (ref 26.0–34.0)
MCHC: 33.6 g/dL (ref 32.0–36.0)
MCV: 93.7 fL (ref 80.0–100.0)
Monocytes Absolute: 0.6 10*3/uL (ref 0.2–1.0)
Monocytes Relative: 11 %
NEUTROS ABS: 2.9 10*3/uL (ref 1.4–6.5)
NEUTROS PCT: 52 %
Platelets: 181 10*3/uL (ref 150–440)
RBC: 4.19 MIL/uL — ABNORMAL LOW (ref 4.40–5.90)
RDW: 14.8 % — AB (ref 11.5–14.5)
WBC: 5.6 10*3/uL (ref 3.8–10.6)

## 2016-11-09 DIAGNOSIS — R41841 Cognitive communication deficit: Secondary | ICD-10-CM | POA: Diagnosis not present

## 2016-11-09 DIAGNOSIS — M109 Gout, unspecified: Secondary | ICD-10-CM | POA: Diagnosis not present

## 2016-11-09 DIAGNOSIS — R1312 Dysphagia, oropharyngeal phase: Secondary | ICD-10-CM | POA: Diagnosis not present

## 2016-11-09 DIAGNOSIS — G40909 Epilepsy, unspecified, not intractable, without status epilepticus: Secondary | ICD-10-CM | POA: Diagnosis not present

## 2016-11-10 ENCOUNTER — Other Ambulatory Visit: Payer: Self-pay

## 2016-11-10 DIAGNOSIS — M109 Gout, unspecified: Secondary | ICD-10-CM | POA: Diagnosis not present

## 2016-11-10 DIAGNOSIS — R1312 Dysphagia, oropharyngeal phase: Secondary | ICD-10-CM | POA: Diagnosis not present

## 2016-11-10 DIAGNOSIS — G40909 Epilepsy, unspecified, not intractable, without status epilepticus: Secondary | ICD-10-CM | POA: Diagnosis not present

## 2016-11-10 DIAGNOSIS — R41841 Cognitive communication deficit: Secondary | ICD-10-CM | POA: Diagnosis not present

## 2016-11-10 DIAGNOSIS — R748 Abnormal levels of other serum enzymes: Secondary | ICD-10-CM

## 2016-11-12 DIAGNOSIS — M109 Gout, unspecified: Secondary | ICD-10-CM | POA: Diagnosis not present

## 2016-11-13 DIAGNOSIS — M109 Gout, unspecified: Secondary | ICD-10-CM | POA: Diagnosis not present

## 2016-11-16 DIAGNOSIS — M109 Gout, unspecified: Secondary | ICD-10-CM | POA: Diagnosis not present

## 2016-11-17 DIAGNOSIS — M109 Gout, unspecified: Secondary | ICD-10-CM | POA: Diagnosis not present

## 2016-11-18 DIAGNOSIS — M109 Gout, unspecified: Secondary | ICD-10-CM | POA: Diagnosis not present

## 2016-11-19 DIAGNOSIS — M109 Gout, unspecified: Secondary | ICD-10-CM | POA: Diagnosis not present

## 2016-11-20 DIAGNOSIS — M109 Gout, unspecified: Secondary | ICD-10-CM | POA: Diagnosis not present

## 2016-11-20 DIAGNOSIS — R1312 Dysphagia, oropharyngeal phase: Secondary | ICD-10-CM | POA: Diagnosis not present

## 2016-11-20 DIAGNOSIS — D649 Anemia, unspecified: Secondary | ICD-10-CM | POA: Diagnosis not present

## 2016-11-20 DIAGNOSIS — E039 Hypothyroidism, unspecified: Secondary | ICD-10-CM | POA: Diagnosis not present

## 2016-11-20 DIAGNOSIS — K219 Gastro-esophageal reflux disease without esophagitis: Secondary | ICD-10-CM | POA: Diagnosis not present

## 2016-11-20 DIAGNOSIS — I10 Essential (primary) hypertension: Secondary | ICD-10-CM | POA: Diagnosis not present

## 2016-11-23 DIAGNOSIS — M109 Gout, unspecified: Secondary | ICD-10-CM | POA: Diagnosis not present

## 2016-11-24 DIAGNOSIS — M109 Gout, unspecified: Secondary | ICD-10-CM | POA: Diagnosis not present

## 2016-11-25 DIAGNOSIS — M109 Gout, unspecified: Secondary | ICD-10-CM | POA: Diagnosis not present

## 2016-11-26 DIAGNOSIS — M109 Gout, unspecified: Secondary | ICD-10-CM | POA: Diagnosis not present

## 2016-11-27 DIAGNOSIS — R4182 Altered mental status, unspecified: Secondary | ICD-10-CM | POA: Diagnosis not present

## 2016-12-10 DIAGNOSIS — M109 Gout, unspecified: Secondary | ICD-10-CM | POA: Diagnosis not present

## 2016-12-10 DIAGNOSIS — M6281 Muscle weakness (generalized): Secondary | ICD-10-CM | POA: Diagnosis not present

## 2016-12-10 DIAGNOSIS — Q858 Other phakomatoses, not elsewhere classified: Secondary | ICD-10-CM | POA: Diagnosis not present

## 2016-12-10 DIAGNOSIS — R1312 Dysphagia, oropharyngeal phase: Secondary | ICD-10-CM | POA: Diagnosis not present

## 2016-12-15 DIAGNOSIS — M109 Gout, unspecified: Secondary | ICD-10-CM | POA: Diagnosis not present

## 2016-12-15 DIAGNOSIS — M2550 Pain in unspecified joint: Secondary | ICD-10-CM | POA: Diagnosis not present

## 2016-12-16 DIAGNOSIS — M109 Gout, unspecified: Secondary | ICD-10-CM | POA: Diagnosis not present

## 2016-12-16 DIAGNOSIS — M2550 Pain in unspecified joint: Secondary | ICD-10-CM | POA: Diagnosis not present

## 2016-12-17 DIAGNOSIS — M109 Gout, unspecified: Secondary | ICD-10-CM | POA: Diagnosis not present

## 2016-12-17 DIAGNOSIS — M2550 Pain in unspecified joint: Secondary | ICD-10-CM | POA: Diagnosis not present

## 2016-12-18 DIAGNOSIS — M109 Gout, unspecified: Secondary | ICD-10-CM | POA: Diagnosis not present

## 2016-12-18 DIAGNOSIS — M2550 Pain in unspecified joint: Secondary | ICD-10-CM | POA: Diagnosis not present

## 2016-12-21 DIAGNOSIS — M2550 Pain in unspecified joint: Secondary | ICD-10-CM | POA: Diagnosis not present

## 2016-12-21 DIAGNOSIS — M109 Gout, unspecified: Secondary | ICD-10-CM | POA: Diagnosis not present

## 2016-12-22 DIAGNOSIS — M109 Gout, unspecified: Secondary | ICD-10-CM | POA: Diagnosis not present

## 2016-12-22 DIAGNOSIS — M2550 Pain in unspecified joint: Secondary | ICD-10-CM | POA: Diagnosis not present

## 2016-12-23 DIAGNOSIS — M109 Gout, unspecified: Secondary | ICD-10-CM | POA: Diagnosis not present

## 2016-12-23 DIAGNOSIS — M2550 Pain in unspecified joint: Secondary | ICD-10-CM | POA: Diagnosis not present

## 2016-12-24 DIAGNOSIS — M2550 Pain in unspecified joint: Secondary | ICD-10-CM | POA: Diagnosis not present

## 2016-12-24 DIAGNOSIS — M109 Gout, unspecified: Secondary | ICD-10-CM | POA: Diagnosis not present

## 2016-12-25 DIAGNOSIS — M2550 Pain in unspecified joint: Secondary | ICD-10-CM | POA: Diagnosis not present

## 2016-12-25 DIAGNOSIS — M109 Gout, unspecified: Secondary | ICD-10-CM | POA: Diagnosis not present

## 2016-12-28 DIAGNOSIS — M2550 Pain in unspecified joint: Secondary | ICD-10-CM | POA: Diagnosis not present

## 2016-12-28 DIAGNOSIS — Z79899 Other long term (current) drug therapy: Secondary | ICD-10-CM | POA: Diagnosis not present

## 2016-12-28 DIAGNOSIS — M109 Gout, unspecified: Secondary | ICD-10-CM | POA: Diagnosis not present

## 2016-12-29 DIAGNOSIS — M2550 Pain in unspecified joint: Secondary | ICD-10-CM | POA: Diagnosis not present

## 2016-12-29 DIAGNOSIS — M109 Gout, unspecified: Secondary | ICD-10-CM | POA: Diagnosis not present

## 2016-12-30 DIAGNOSIS — M109 Gout, unspecified: Secondary | ICD-10-CM | POA: Diagnosis not present

## 2016-12-30 DIAGNOSIS — M2550 Pain in unspecified joint: Secondary | ICD-10-CM | POA: Diagnosis not present

## 2016-12-31 DIAGNOSIS — M2550 Pain in unspecified joint: Secondary | ICD-10-CM | POA: Diagnosis not present

## 2016-12-31 DIAGNOSIS — M109 Gout, unspecified: Secondary | ICD-10-CM | POA: Diagnosis not present

## 2016-12-31 DIAGNOSIS — R41841 Cognitive communication deficit: Secondary | ICD-10-CM | POA: Diagnosis not present

## 2016-12-31 DIAGNOSIS — E119 Type 2 diabetes mellitus without complications: Secondary | ICD-10-CM | POA: Diagnosis not present

## 2016-12-31 DIAGNOSIS — R1312 Dysphagia, oropharyngeal phase: Secondary | ICD-10-CM | POA: Diagnosis not present

## 2016-12-31 DIAGNOSIS — G40909 Epilepsy, unspecified, not intractable, without status epilepticus: Secondary | ICD-10-CM | POA: Diagnosis not present

## 2017-01-01 DIAGNOSIS — K219 Gastro-esophageal reflux disease without esophagitis: Secondary | ICD-10-CM | POA: Diagnosis not present

## 2017-01-01 DIAGNOSIS — E039 Hypothyroidism, unspecified: Secondary | ICD-10-CM | POA: Diagnosis not present

## 2017-01-01 DIAGNOSIS — M2550 Pain in unspecified joint: Secondary | ICD-10-CM | POA: Diagnosis not present

## 2017-01-01 DIAGNOSIS — M109 Gout, unspecified: Secondary | ICD-10-CM | POA: Diagnosis not present

## 2017-01-01 DIAGNOSIS — R1312 Dysphagia, oropharyngeal phase: Secondary | ICD-10-CM | POA: Diagnosis not present

## 2017-01-01 DIAGNOSIS — I10 Essential (primary) hypertension: Secondary | ICD-10-CM | POA: Diagnosis not present

## 2017-01-03 DIAGNOSIS — M2550 Pain in unspecified joint: Secondary | ICD-10-CM | POA: Diagnosis not present

## 2017-01-03 DIAGNOSIS — M109 Gout, unspecified: Secondary | ICD-10-CM | POA: Diagnosis not present

## 2017-01-04 DIAGNOSIS — M109 Gout, unspecified: Secondary | ICD-10-CM | POA: Diagnosis not present

## 2017-01-04 DIAGNOSIS — M2550 Pain in unspecified joint: Secondary | ICD-10-CM | POA: Diagnosis not present

## 2017-01-06 DIAGNOSIS — M2550 Pain in unspecified joint: Secondary | ICD-10-CM | POA: Diagnosis not present

## 2017-01-06 DIAGNOSIS — M109 Gout, unspecified: Secondary | ICD-10-CM | POA: Diagnosis not present

## 2017-01-07 DIAGNOSIS — M2550 Pain in unspecified joint: Secondary | ICD-10-CM | POA: Diagnosis not present

## 2017-01-07 DIAGNOSIS — M109 Gout, unspecified: Secondary | ICD-10-CM | POA: Diagnosis not present

## 2017-01-09 DIAGNOSIS — M109 Gout, unspecified: Secondary | ICD-10-CM | POA: Diagnosis not present

## 2017-01-09 DIAGNOSIS — M2550 Pain in unspecified joint: Secondary | ICD-10-CM | POA: Diagnosis not present

## 2017-01-11 DIAGNOSIS — M2550 Pain in unspecified joint: Secondary | ICD-10-CM | POA: Diagnosis not present

## 2017-01-12 DIAGNOSIS — M2550 Pain in unspecified joint: Secondary | ICD-10-CM | POA: Diagnosis not present

## 2017-01-13 DIAGNOSIS — M2550 Pain in unspecified joint: Secondary | ICD-10-CM | POA: Diagnosis not present

## 2017-01-14 DIAGNOSIS — M2550 Pain in unspecified joint: Secondary | ICD-10-CM | POA: Diagnosis not present

## 2017-01-15 DIAGNOSIS — M2550 Pain in unspecified joint: Secondary | ICD-10-CM | POA: Diagnosis not present

## 2017-01-18 DIAGNOSIS — M2550 Pain in unspecified joint: Secondary | ICD-10-CM | POA: Diagnosis not present

## 2017-01-19 DIAGNOSIS — M2550 Pain in unspecified joint: Secondary | ICD-10-CM | POA: Diagnosis not present

## 2017-01-20 DIAGNOSIS — M2550 Pain in unspecified joint: Secondary | ICD-10-CM | POA: Diagnosis not present

## 2017-01-21 DIAGNOSIS — M2550 Pain in unspecified joint: Secondary | ICD-10-CM | POA: Diagnosis not present

## 2017-01-22 DIAGNOSIS — M2550 Pain in unspecified joint: Secondary | ICD-10-CM | POA: Diagnosis not present

## 2017-01-25 DIAGNOSIS — M2550 Pain in unspecified joint: Secondary | ICD-10-CM | POA: Diagnosis not present

## 2017-01-26 DIAGNOSIS — Z23 Encounter for immunization: Secondary | ICD-10-CM | POA: Diagnosis not present

## 2017-01-26 DIAGNOSIS — M2550 Pain in unspecified joint: Secondary | ICD-10-CM | POA: Diagnosis not present

## 2017-01-27 DIAGNOSIS — M2550 Pain in unspecified joint: Secondary | ICD-10-CM | POA: Diagnosis not present

## 2017-01-28 DIAGNOSIS — M2550 Pain in unspecified joint: Secondary | ICD-10-CM | POA: Diagnosis not present

## 2017-01-29 DIAGNOSIS — M2550 Pain in unspecified joint: Secondary | ICD-10-CM | POA: Diagnosis not present

## 2017-02-01 DIAGNOSIS — M2550 Pain in unspecified joint: Secondary | ICD-10-CM | POA: Diagnosis not present

## 2017-02-02 DIAGNOSIS — M2550 Pain in unspecified joint: Secondary | ICD-10-CM | POA: Diagnosis not present

## 2017-02-03 DIAGNOSIS — M2550 Pain in unspecified joint: Secondary | ICD-10-CM | POA: Diagnosis not present

## 2017-02-04 DIAGNOSIS — M2550 Pain in unspecified joint: Secondary | ICD-10-CM | POA: Diagnosis not present

## 2017-02-05 DIAGNOSIS — M2550 Pain in unspecified joint: Secondary | ICD-10-CM | POA: Diagnosis not present

## 2017-02-08 DIAGNOSIS — M2550 Pain in unspecified joint: Secondary | ICD-10-CM | POA: Diagnosis not present

## 2017-02-09 DIAGNOSIS — M2550 Pain in unspecified joint: Secondary | ICD-10-CM | POA: Diagnosis not present

## 2017-02-10 DIAGNOSIS — M2550 Pain in unspecified joint: Secondary | ICD-10-CM | POA: Diagnosis not present

## 2017-02-11 DIAGNOSIS — M2550 Pain in unspecified joint: Secondary | ICD-10-CM | POA: Diagnosis not present

## 2017-02-12 DIAGNOSIS — M2550 Pain in unspecified joint: Secondary | ICD-10-CM | POA: Diagnosis not present

## 2017-02-15 DIAGNOSIS — M2550 Pain in unspecified joint: Secondary | ICD-10-CM | POA: Diagnosis not present

## 2017-02-16 DIAGNOSIS — M2550 Pain in unspecified joint: Secondary | ICD-10-CM | POA: Diagnosis not present

## 2017-02-26 DIAGNOSIS — E039 Hypothyroidism, unspecified: Secondary | ICD-10-CM | POA: Diagnosis not present

## 2017-02-26 DIAGNOSIS — K219 Gastro-esophageal reflux disease without esophagitis: Secondary | ICD-10-CM | POA: Diagnosis not present

## 2017-02-26 DIAGNOSIS — I10 Essential (primary) hypertension: Secondary | ICD-10-CM | POA: Diagnosis not present

## 2017-02-26 DIAGNOSIS — R1312 Dysphagia, oropharyngeal phase: Secondary | ICD-10-CM | POA: Diagnosis not present

## 2017-03-09 DIAGNOSIS — I10 Essential (primary) hypertension: Secondary | ICD-10-CM | POA: Diagnosis not present

## 2017-03-09 DIAGNOSIS — F79 Unspecified intellectual disabilities: Secondary | ICD-10-CM | POA: Diagnosis not present

## 2017-03-09 DIAGNOSIS — Q858 Other phakomatoses, not elsewhere classified: Secondary | ICD-10-CM | POA: Diagnosis not present

## 2017-03-09 DIAGNOSIS — R600 Localized edema: Secondary | ICD-10-CM | POA: Diagnosis not present

## 2017-03-25 ENCOUNTER — Other Ambulatory Visit (HOSPITAL_COMMUNITY): Payer: Self-pay | Admitting: Hospice and Palliative Medicine

## 2017-03-26 DIAGNOSIS — I739 Peripheral vascular disease, unspecified: Secondary | ICD-10-CM | POA: Diagnosis not present

## 2017-03-26 DIAGNOSIS — L603 Nail dystrophy: Secondary | ICD-10-CM | POA: Diagnosis not present

## 2017-03-26 DIAGNOSIS — R262 Difficulty in walking, not elsewhere classified: Secondary | ICD-10-CM | POA: Diagnosis not present

## 2017-03-26 DIAGNOSIS — B351 Tinea unguium: Secondary | ICD-10-CM | POA: Diagnosis not present

## 2017-03-30 ENCOUNTER — Other Ambulatory Visit (HOSPITAL_COMMUNITY): Payer: Self-pay | Admitting: Hospice and Palliative Medicine

## 2017-03-30 DIAGNOSIS — G40909 Epilepsy, unspecified, not intractable, without status epilepticus: Secondary | ICD-10-CM

## 2017-04-08 DIAGNOSIS — I739 Peripheral vascular disease, unspecified: Secondary | ICD-10-CM | POA: Diagnosis not present

## 2017-04-08 DIAGNOSIS — E119 Type 2 diabetes mellitus without complications: Secondary | ICD-10-CM | POA: Diagnosis not present

## 2017-04-08 DIAGNOSIS — R1312 Dysphagia, oropharyngeal phase: Secondary | ICD-10-CM | POA: Diagnosis not present

## 2017-04-08 DIAGNOSIS — I10 Essential (primary) hypertension: Secondary | ICD-10-CM | POA: Diagnosis not present

## 2017-04-09 ENCOUNTER — Ambulatory Visit (HOSPITAL_COMMUNITY)
Admission: RE | Admit: 2017-04-09 | Discharge: 2017-04-09 | Disposition: A | Discharge status: Home / Self Care | Payer: Medicare Other | Source: Ambulatory Visit | Attending: Hospice and Palliative Medicine | Admitting: Hospice and Palliative Medicine

## 2017-04-09 DIAGNOSIS — G40909 Epilepsy, unspecified, not intractable, without status epilepticus: Secondary | ICD-10-CM | POA: Diagnosis not present

## 2017-04-09 DIAGNOSIS — R569 Unspecified convulsions: Secondary | ICD-10-CM | POA: Diagnosis not present

## 2017-04-09 DIAGNOSIS — G3189 Other specified degenerative diseases of nervous system: Secondary | ICD-10-CM | POA: Diagnosis not present

## 2017-04-11 DIAGNOSIS — D72829 Elevated white blood cell count, unspecified: Secondary | ICD-10-CM | POA: Diagnosis not present

## 2017-04-11 DIAGNOSIS — Z79899 Other long term (current) drug therapy: Secondary | ICD-10-CM | POA: Diagnosis not present

## 2017-04-13 DIAGNOSIS — R600 Localized edema: Secondary | ICD-10-CM | POA: Diagnosis not present

## 2017-04-13 DIAGNOSIS — L03115 Cellulitis of right lower limb: Secondary | ICD-10-CM | POA: Diagnosis not present

## 2017-04-13 DIAGNOSIS — E119 Type 2 diabetes mellitus without complications: Secondary | ICD-10-CM | POA: Diagnosis not present

## 2017-04-14 DIAGNOSIS — Z79899 Other long term (current) drug therapy: Secondary | ICD-10-CM | POA: Diagnosis not present

## 2017-04-14 DIAGNOSIS — R2241 Localized swelling, mass and lump, right lower limb: Secondary | ICD-10-CM | POA: Diagnosis not present

## 2017-04-14 DIAGNOSIS — D649 Anemia, unspecified: Secondary | ICD-10-CM | POA: Diagnosis not present

## 2017-04-15 DIAGNOSIS — M79671 Pain in right foot: Secondary | ICD-10-CM | POA: Diagnosis not present

## 2017-04-15 DIAGNOSIS — M25571 Pain in right ankle and joints of right foot: Secondary | ICD-10-CM | POA: Diagnosis not present

## 2017-04-17 ENCOUNTER — Other Ambulatory Visit
Admission: RE | Admit: 2017-04-17 | Discharge: 2017-04-17 | Disposition: A | Discharge status: Home / Self Care | Payer: Medicare Other | Source: Ambulatory Visit | Attending: Internal Medicine | Admitting: Internal Medicine

## 2017-04-17 DIAGNOSIS — R41841 Cognitive communication deficit: Secondary | ICD-10-CM | POA: Insufficient documentation

## 2017-04-17 LAB — VANCOMYCIN, TROUGH: Vancomycin Tr: 14 ug/mL — ABNORMAL LOW (ref 15–20)

## 2017-04-17 LAB — BUN: BUN: 16 mg/dL (ref 6–20)

## 2017-04-20 ENCOUNTER — Encounter: Payer: Self-pay | Admitting: Internal Medicine

## 2017-04-20 DIAGNOSIS — L03115 Cellulitis of right lower limb: Secondary | ICD-10-CM | POA: Diagnosis not present

## 2017-04-20 DIAGNOSIS — E119 Type 2 diabetes mellitus without complications: Secondary | ICD-10-CM | POA: Diagnosis not present

## 2017-04-20 DIAGNOSIS — R600 Localized edema: Secondary | ICD-10-CM | POA: Diagnosis not present

## 2017-04-29 DIAGNOSIS — R600 Localized edema: Secondary | ICD-10-CM | POA: Diagnosis not present

## 2017-04-29 DIAGNOSIS — R2232 Localized swelling, mass and lump, left upper limb: Secondary | ICD-10-CM | POA: Diagnosis not present

## 2017-04-29 DIAGNOSIS — M79632 Pain in left forearm: Secondary | ICD-10-CM | POA: Diagnosis not present

## 2017-04-29 DIAGNOSIS — M79602 Pain in left arm: Secondary | ICD-10-CM | POA: Diagnosis not present

## 2017-04-29 DIAGNOSIS — M79642 Pain in left hand: Secondary | ICD-10-CM | POA: Diagnosis not present

## 2017-04-29 DIAGNOSIS — L03115 Cellulitis of right lower limb: Secondary | ICD-10-CM | POA: Diagnosis not present

## 2017-04-29 DIAGNOSIS — E119 Type 2 diabetes mellitus without complications: Secondary | ICD-10-CM | POA: Diagnosis not present

## 2017-04-29 DIAGNOSIS — R2230 Localized swelling, mass and lump, unspecified upper limb: Secondary | ICD-10-CM | POA: Diagnosis not present

## 2017-05-04 DIAGNOSIS — D649 Anemia, unspecified: Secondary | ICD-10-CM | POA: Diagnosis not present

## 2017-05-04 DIAGNOSIS — Z79899 Other long term (current) drug therapy: Secondary | ICD-10-CM | POA: Diagnosis not present

## 2017-05-04 DIAGNOSIS — E119 Type 2 diabetes mellitus without complications: Secondary | ICD-10-CM | POA: Diagnosis not present

## 2017-05-07 DIAGNOSIS — R2230 Localized swelling, mass and lump, unspecified upper limb: Secondary | ICD-10-CM | POA: Diagnosis not present

## 2017-05-11 DIAGNOSIS — I10 Essential (primary) hypertension: Secondary | ICD-10-CM | POA: Diagnosis not present

## 2017-05-11 DIAGNOSIS — R1312 Dysphagia, oropharyngeal phase: Secondary | ICD-10-CM | POA: Diagnosis not present

## 2017-05-11 DIAGNOSIS — K219 Gastro-esophageal reflux disease without esophagitis: Secondary | ICD-10-CM | POA: Diagnosis not present

## 2017-05-11 DIAGNOSIS — E039 Hypothyroidism, unspecified: Secondary | ICD-10-CM | POA: Diagnosis not present

## 2017-05-25 DIAGNOSIS — M6281 Muscle weakness (generalized): Secondary | ICD-10-CM | POA: Diagnosis not present

## 2017-05-25 DIAGNOSIS — Q858 Other phakomatoses, not elsewhere classified: Secondary | ICD-10-CM | POA: Diagnosis not present

## 2017-05-25 DIAGNOSIS — G40909 Epilepsy, unspecified, not intractable, without status epilepticus: Secondary | ICD-10-CM | POA: Diagnosis not present

## 2017-05-25 DIAGNOSIS — R600 Localized edema: Secondary | ICD-10-CM | POA: Diagnosis not present

## 2017-05-25 DIAGNOSIS — R262 Difficulty in walking, not elsewhere classified: Secondary | ICD-10-CM | POA: Diagnosis not present

## 2017-05-26 ENCOUNTER — Ambulatory Visit (INDEPENDENT_AMBULATORY_CARE_PROVIDER_SITE_OTHER): Payer: Medicare Other | Admitting: Neurology

## 2017-05-26 ENCOUNTER — Encounter: Payer: Self-pay | Admitting: Neurology

## 2017-05-26 ENCOUNTER — Other Ambulatory Visit: Payer: Self-pay

## 2017-05-26 VITALS — BP 106/64 | HR 66 | Ht 64.0 in

## 2017-05-26 DIAGNOSIS — R262 Difficulty in walking, not elsewhere classified: Secondary | ICD-10-CM | POA: Diagnosis not present

## 2017-05-26 DIAGNOSIS — Z87898 Personal history of other specified conditions: Secondary | ICD-10-CM | POA: Diagnosis not present

## 2017-05-26 DIAGNOSIS — Q858 Other phakomatoses, not elsewhere classified: Secondary | ICD-10-CM | POA: Diagnosis not present

## 2017-05-26 DIAGNOSIS — Q8589 Other phakomatoses, not elsewhere classified: Secondary | ICD-10-CM

## 2017-05-26 NOTE — Patient Instructions (Signed)
Continue supportive care. Follow-up in 1 year, call for any changes.

## 2017-05-26 NOTE — Progress Notes (Signed)
NEUROLOGY CONSULTATION NOTE  Gary Richard MRN: [id number] DOB: 01/18/1956  Referring provider: Dr. Vernell Morgans Primary care provider: Dr. Monico Blitz  Reason for consult:  seizures  Dear Dr Mal Amabile:  Thank you for your kind referral of Gary Richard for consultation of the above symptoms. Although his history is well known to you, please allow me to reiterate it for the purpose of our medical record. The patient was accompanied to the clinic by [facility]. Records and images were personally reviewed where available.  HISTORY OF PRESENT ILLNESS: This is a very pleasant 62 year old man with a history of diabetes, hypertension, hyperlipidemia, Sturge-Weber syndrome, intellectual disability, per referral here for "history of seizure." Staff with him today does not know why he is here and is not aware of any seizures. I spoke to referring nurse practitioner Wynell Balloon on the phone, she reports that due to his history of seizures, he needs to see a neurologist as part of Medicaid requirements. He has been at [facility] since February and she has not witnessed any seizures or seizure-like activity. He is not on any seizure medication. He has intellectual disability and cannot answer questions, he can follow simple commands and say a few words, mostly laughing heartily when asked questions. There is no prior history attached to his records, none on Epic to review. There is a head CT without contrast done 04/09/17 available for review, I personally reviewed images, there is chronic left hemispheric atrophy with calcifications, mild cerebellar atrophy worse on right, no acute changes. There is asymmetric left calvarial thickening commensurate to the left-sided atrophy.   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 No other history available  PAST MEDICAL HISTORY: Past Medical History:  Diagnosis Date  . DM (diabetes mellitus) ([place])   . Gout   . HTN (hypertension)   .  Hypertriglyceridemia   . Mental retardation   . Sturge-Weber syndrome ([place])     PAST SURGICAL HISTORY: Past Surgical History:  Procedure Laterality Date  . COLONOSCOPY WITH PROPOFOL N/A 09/30/2015   RMR: Diverticulosis in the entire colon, 2 polyps removed, pathology with sessile serrated adenoma with cytologic dysplasia.Marland Kitchen Next colonoscopy recommended for June 2019  . ESOPHAGOGASTRODUODENOSCOPY (EGD) WITH PROPOFOL N/A 09/30/2015   RMR: LA grade a esophagitis, gastritis, pathology with focal chronic inactive gastritis, no H pylori.  Marland Kitchen PORTACATH PLACEMENT Right    years ago  . TONGUE SURGERY      MEDICATIONS: Current Outpatient Medications on File Prior to Visit  Medication Sig Dispense Refill  . amLODipine (NORVASC) 5 MG tablet Take 5 mg by mouth daily.    Marland Kitchen aspirin 81 MG tablet Take 81 mg by mouth daily.    Marland Kitchen gemfibrozil (LOPID) 600 MG tablet Take 600 mg by mouth 2 (two) times daily before a meal.    . hydrochlorothiazide (MICROZIDE) 12.5 MG capsule Take 12.5 mg by mouth daily.    . metFORMIN (GLUCOPHAGE-XR) 500 MG 24 hr tablet Take 500 mg by mouth daily with breakfast.    . pravastatin (PRAVACHOL) 10 MG tablet Take 10 mg by mouth daily.     No current facility-administered medications on file prior to visit.     ALLERGIES: No Known Allergies  FAMILY HISTORY: Family History  Problem Relation Age of Onset  . Other Unknown        unknown    SOCIAL HISTORY: Social History   Socioeconomic History  . Marital status: Unknown    Spouse name: Not on  file  . Number of children: Not on file  . Years of education: Not on file  . Highest education level: Not on file  Social Needs  . Financial resource strain: Not on file  . Food insecurity - worry: Not on file  . Food insecurity - inability: Not on file  . Transportation needs - medical: Not on file  . Transportation needs - non-medical: Not on file  Occupational History  . Not on file  Tobacco Use  . Smoking status: Never  Smoker  . Smokeless tobacco: Never Used  Substance and Sexual Activity  . Alcohol use: No    Alcohol/week: 0.0 oz  . Drug use: No  . Sexual activity: Not on file  Other Topics Concern  . Not on file  Social History Narrative   Lives in same group home over 30 years.     REVIEW OF SYSTEMS unable to obtain due to cognitive and speech deficits  PHYSICAL EXAM: Vitals:   05/26/17 0924  BP: 106/64  Pulse: 66  SpO2: 93%   General: No acute distress, sitting on wheelchair Head:  Portwine stain on left side of face (some on right cheek), upper and lower lip hyperplasia Eyes: keeps left eye closed, resists eye opening Neck: supple, no paraspinal tenderness, full range of motion Back: No paraspinal tenderness Heart: regular rate and rhythm Lungs: Clear to auscultation bilaterally. Vascular: No carotid bruits. Skin/Extremities: No rash, bilateral leg edema Neurological Exam: Mental status: alert and oriented to person. He is dysarthric, able to say a few words such as "birthday" when asked about it and said "I'm getting some shirts again," but mostly laughing heartily when asked questions and not answering questions.  Cranial nerves: CN I: not tested CN II: right pupil round reactive to light, keeps left eye closed and resists eye opening, visual fields appear intact, he shows me how many fingers I hold up. Unable to visualize fundi. CN III, IV, VI:  full range of motion on right, no nystagmus CN V: unable to test CN VII: he has bilateral lip enlargement and coarse facies CN VIII: hearing intact to voice CN XI: sternocleidomastoid and trapezius muscles intact CN XII: tongue midline Bulk & Tone: atrophy and spastic contracture on right UE (flexed at elbow and wrist), cogwheeling on left UE Motor: 5/5 on left UE, at least 4/5 on both LE, 0/5 on right UE Sensation: unable to test due to cognitive deficits Deep Tendon Reflexes: unable to elicit on right UE, +1 on left UE and both  LE Plantar responses: downgoing bilaterally Cerebellar: no incoordination on finger to nose on left UE Gait: not tested, he needs 2-person assist with walker at baseline Tremor: left hand resting tremor, no clear postural tremor, mild endpoint tremor on left  IMPRESSION: This is a very pleasant 62 year old man with a history of hypertension, hyperlipidemia, diabetes, Sturge-Weber syndrome with a history of seizures presenting to establish care. He is not on any seizure medications. His head CT shows atrophy and calcifications on the left hemisphere consistent with Sturge-Weber syndrome, which certainly increases risk for seizures, but from report from staff, no seizures have been witnessed, continue to monitor clinically for now and hold off on AEDs. He will follow-up in 1 year, staff knows to call for any changes.   Thank you for allowing me to participate in the care of this patient. Please do not hesitate to call for any questions or concerns.   Ellouise Newer, M.D.  CC: Dr. Mal Amabile,  Dr. Manuella Ghazi

## 2017-05-27 DIAGNOSIS — R262 Difficulty in walking, not elsewhere classified: Secondary | ICD-10-CM | POA: Diagnosis not present

## 2017-05-28 DIAGNOSIS — R262 Difficulty in walking, not elsewhere classified: Secondary | ICD-10-CM | POA: Diagnosis not present

## 2017-05-29 DIAGNOSIS — R262 Difficulty in walking, not elsewhere classified: Secondary | ICD-10-CM | POA: Diagnosis not present

## 2017-05-31 DIAGNOSIS — R262 Difficulty in walking, not elsewhere classified: Secondary | ICD-10-CM | POA: Diagnosis not present

## 2017-06-01 DIAGNOSIS — R262 Difficulty in walking, not elsewhere classified: Secondary | ICD-10-CM | POA: Diagnosis not present

## 2017-06-02 DIAGNOSIS — R262 Difficulty in walking, not elsewhere classified: Secondary | ICD-10-CM | POA: Diagnosis not present

## 2017-06-03 DIAGNOSIS — R262 Difficulty in walking, not elsewhere classified: Secondary | ICD-10-CM | POA: Diagnosis not present

## 2017-06-04 DIAGNOSIS — R262 Difficulty in walking, not elsewhere classified: Secondary | ICD-10-CM | POA: Diagnosis not present

## 2017-06-07 DIAGNOSIS — R262 Difficulty in walking, not elsewhere classified: Secondary | ICD-10-CM | POA: Diagnosis not present

## 2017-06-08 DIAGNOSIS — I739 Peripheral vascular disease, unspecified: Secondary | ICD-10-CM | POA: Diagnosis not present

## 2017-06-08 DIAGNOSIS — B351 Tinea unguium: Secondary | ICD-10-CM | POA: Diagnosis not present

## 2017-06-08 DIAGNOSIS — R262 Difficulty in walking, not elsewhere classified: Secondary | ICD-10-CM | POA: Diagnosis not present

## 2017-06-09 DIAGNOSIS — R262 Difficulty in walking, not elsewhere classified: Secondary | ICD-10-CM | POA: Diagnosis not present

## 2017-06-10 DIAGNOSIS — M6281 Muscle weakness (generalized): Secondary | ICD-10-CM | POA: Diagnosis not present

## 2017-06-10 DIAGNOSIS — G40909 Epilepsy, unspecified, not intractable, without status epilepticus: Secondary | ICD-10-CM | POA: Diagnosis not present

## 2017-06-10 DIAGNOSIS — R600 Localized edema: Secondary | ICD-10-CM | POA: Diagnosis not present

## 2017-06-10 DIAGNOSIS — R262 Difficulty in walking, not elsewhere classified: Secondary | ICD-10-CM | POA: Diagnosis not present

## 2017-06-10 DIAGNOSIS — Q858 Other phakomatoses, not elsewhere classified: Secondary | ICD-10-CM | POA: Diagnosis not present

## 2017-06-11 DIAGNOSIS — R262 Difficulty in walking, not elsewhere classified: Secondary | ICD-10-CM | POA: Diagnosis not present

## 2017-06-15 DIAGNOSIS — R262 Difficulty in walking, not elsewhere classified: Secondary | ICD-10-CM | POA: Diagnosis not present

## 2017-06-16 DIAGNOSIS — R262 Difficulty in walking, not elsewhere classified: Secondary | ICD-10-CM | POA: Diagnosis not present

## 2017-06-17 DIAGNOSIS — R262 Difficulty in walking, not elsewhere classified: Secondary | ICD-10-CM | POA: Diagnosis not present

## 2017-06-18 DIAGNOSIS — R262 Difficulty in walking, not elsewhere classified: Secondary | ICD-10-CM | POA: Diagnosis not present

## 2017-06-18 DIAGNOSIS — K219 Gastro-esophageal reflux disease without esophagitis: Secondary | ICD-10-CM | POA: Diagnosis not present

## 2017-06-18 DIAGNOSIS — R1312 Dysphagia, oropharyngeal phase: Secondary | ICD-10-CM | POA: Diagnosis not present

## 2017-06-18 DIAGNOSIS — E039 Hypothyroidism, unspecified: Secondary | ICD-10-CM | POA: Diagnosis not present

## 2017-06-18 DIAGNOSIS — I10 Essential (primary) hypertension: Secondary | ICD-10-CM | POA: Diagnosis not present

## 2017-06-19 DIAGNOSIS — R262 Difficulty in walking, not elsewhere classified: Secondary | ICD-10-CM | POA: Diagnosis not present

## 2017-06-21 DIAGNOSIS — R262 Difficulty in walking, not elsewhere classified: Secondary | ICD-10-CM | POA: Diagnosis not present

## 2017-06-22 DIAGNOSIS — R262 Difficulty in walking, not elsewhere classified: Secondary | ICD-10-CM | POA: Diagnosis not present

## 2017-06-23 DIAGNOSIS — R262 Difficulty in walking, not elsewhere classified: Secondary | ICD-10-CM | POA: Diagnosis not present

## 2017-06-24 DIAGNOSIS — R262 Difficulty in walking, not elsewhere classified: Secondary | ICD-10-CM | POA: Diagnosis not present

## 2017-06-25 DIAGNOSIS — R262 Difficulty in walking, not elsewhere classified: Secondary | ICD-10-CM | POA: Diagnosis not present

## 2017-06-28 DIAGNOSIS — R262 Difficulty in walking, not elsewhere classified: Secondary | ICD-10-CM | POA: Diagnosis not present

## 2017-06-29 DIAGNOSIS — R262 Difficulty in walking, not elsewhere classified: Secondary | ICD-10-CM | POA: Diagnosis not present

## 2017-06-30 DIAGNOSIS — R262 Difficulty in walking, not elsewhere classified: Secondary | ICD-10-CM | POA: Diagnosis not present

## 2017-07-01 DIAGNOSIS — R262 Difficulty in walking, not elsewhere classified: Secondary | ICD-10-CM | POA: Diagnosis not present

## 2017-07-02 DIAGNOSIS — R262 Difficulty in walking, not elsewhere classified: Secondary | ICD-10-CM | POA: Diagnosis not present

## 2017-07-05 DIAGNOSIS — R262 Difficulty in walking, not elsewhere classified: Secondary | ICD-10-CM | POA: Diagnosis not present

## 2017-07-06 DIAGNOSIS — R262 Difficulty in walking, not elsewhere classified: Secondary | ICD-10-CM | POA: Diagnosis not present

## 2017-07-07 DIAGNOSIS — R262 Difficulty in walking, not elsewhere classified: Secondary | ICD-10-CM | POA: Diagnosis not present

## 2017-07-08 DIAGNOSIS — R262 Difficulty in walking, not elsewhere classified: Secondary | ICD-10-CM | POA: Diagnosis not present

## 2017-07-09 DIAGNOSIS — R262 Difficulty in walking, not elsewhere classified: Secondary | ICD-10-CM | POA: Diagnosis not present

## 2017-07-12 DIAGNOSIS — R262 Difficulty in walking, not elsewhere classified: Secondary | ICD-10-CM | POA: Diagnosis not present

## 2017-07-13 DIAGNOSIS — R262 Difficulty in walking, not elsewhere classified: Secondary | ICD-10-CM | POA: Diagnosis not present

## 2017-07-14 DIAGNOSIS — R262 Difficulty in walking, not elsewhere classified: Secondary | ICD-10-CM | POA: Diagnosis not present

## 2017-07-15 DIAGNOSIS — R262 Difficulty in walking, not elsewhere classified: Secondary | ICD-10-CM | POA: Diagnosis not present

## 2017-07-16 DIAGNOSIS — R262 Difficulty in walking, not elsewhere classified: Secondary | ICD-10-CM | POA: Diagnosis not present

## 2017-07-18 DIAGNOSIS — R262 Difficulty in walking, not elsewhere classified: Secondary | ICD-10-CM | POA: Diagnosis not present

## 2017-07-19 DIAGNOSIS — R262 Difficulty in walking, not elsewhere classified: Secondary | ICD-10-CM | POA: Diagnosis not present

## 2017-07-20 DIAGNOSIS — R1312 Dysphagia, oropharyngeal phase: Secondary | ICD-10-CM | POA: Diagnosis not present

## 2017-07-20 DIAGNOSIS — R600 Localized edema: Secondary | ICD-10-CM | POA: Diagnosis not present

## 2017-07-20 DIAGNOSIS — E039 Hypothyroidism, unspecified: Secondary | ICD-10-CM | POA: Diagnosis not present

## 2017-07-20 DIAGNOSIS — K219 Gastro-esophageal reflux disease without esophagitis: Secondary | ICD-10-CM | POA: Diagnosis not present

## 2017-08-11 ENCOUNTER — Encounter: Payer: Self-pay | Admitting: Internal Medicine

## 2017-08-16 DIAGNOSIS — R262 Difficulty in walking, not elsewhere classified: Secondary | ICD-10-CM | POA: Diagnosis not present

## 2017-08-16 DIAGNOSIS — E1151 Type 2 diabetes mellitus with diabetic peripheral angiopathy without gangrene: Secondary | ICD-10-CM | POA: Diagnosis not present

## 2017-08-16 DIAGNOSIS — B351 Tinea unguium: Secondary | ICD-10-CM | POA: Diagnosis not present

## 2017-08-24 DIAGNOSIS — R293 Abnormal posture: Secondary | ICD-10-CM | POA: Diagnosis not present

## 2017-08-24 DIAGNOSIS — M6281 Muscle weakness (generalized): Secondary | ICD-10-CM | POA: Diagnosis not present

## 2017-08-25 DIAGNOSIS — M6281 Muscle weakness (generalized): Secondary | ICD-10-CM | POA: Diagnosis not present

## 2017-08-25 DIAGNOSIS — R293 Abnormal posture: Secondary | ICD-10-CM | POA: Diagnosis not present

## 2017-08-26 DIAGNOSIS — R293 Abnormal posture: Secondary | ICD-10-CM | POA: Diagnosis not present

## 2017-08-26 DIAGNOSIS — M6281 Muscle weakness (generalized): Secondary | ICD-10-CM | POA: Diagnosis not present

## 2017-08-27 DIAGNOSIS — M6281 Muscle weakness (generalized): Secondary | ICD-10-CM | POA: Diagnosis not present

## 2017-08-27 DIAGNOSIS — R293 Abnormal posture: Secondary | ICD-10-CM | POA: Diagnosis not present

## 2017-08-29 DIAGNOSIS — M6281 Muscle weakness (generalized): Secondary | ICD-10-CM | POA: Diagnosis not present

## 2017-08-29 DIAGNOSIS — R293 Abnormal posture: Secondary | ICD-10-CM | POA: Diagnosis not present

## 2017-08-30 DIAGNOSIS — E039 Hypothyroidism, unspecified: Secondary | ICD-10-CM | POA: Diagnosis not present

## 2017-08-30 DIAGNOSIS — K219 Gastro-esophageal reflux disease without esophagitis: Secondary | ICD-10-CM | POA: Diagnosis not present

## 2017-08-30 DIAGNOSIS — R1312 Dysphagia, oropharyngeal phase: Secondary | ICD-10-CM | POA: Diagnosis not present

## 2017-08-30 DIAGNOSIS — M6281 Muscle weakness (generalized): Secondary | ICD-10-CM | POA: Diagnosis not present

## 2017-08-30 DIAGNOSIS — R293 Abnormal posture: Secondary | ICD-10-CM | POA: Diagnosis not present

## 2017-08-30 DIAGNOSIS — I10 Essential (primary) hypertension: Secondary | ICD-10-CM | POA: Diagnosis not present

## 2017-08-31 DIAGNOSIS — M6281 Muscle weakness (generalized): Secondary | ICD-10-CM | POA: Diagnosis not present

## 2017-08-31 DIAGNOSIS — R293 Abnormal posture: Secondary | ICD-10-CM | POA: Diagnosis not present

## 2017-09-01 DIAGNOSIS — M6281 Muscle weakness (generalized): Secondary | ICD-10-CM | POA: Diagnosis not present

## 2017-09-01 DIAGNOSIS — R293 Abnormal posture: Secondary | ICD-10-CM | POA: Diagnosis not present

## 2017-09-02 DIAGNOSIS — M6281 Muscle weakness (generalized): Secondary | ICD-10-CM | POA: Diagnosis not present

## 2017-09-02 DIAGNOSIS — R293 Abnormal posture: Secondary | ICD-10-CM | POA: Diagnosis not present

## 2017-09-03 DIAGNOSIS — M6281 Muscle weakness (generalized): Secondary | ICD-10-CM | POA: Diagnosis not present

## 2017-09-03 DIAGNOSIS — R293 Abnormal posture: Secondary | ICD-10-CM | POA: Diagnosis not present

## 2017-09-06 DIAGNOSIS — R293 Abnormal posture: Secondary | ICD-10-CM | POA: Diagnosis not present

## 2017-09-06 DIAGNOSIS — M6281 Muscle weakness (generalized): Secondary | ICD-10-CM | POA: Diagnosis not present

## 2017-09-07 DIAGNOSIS — R293 Abnormal posture: Secondary | ICD-10-CM | POA: Diagnosis not present

## 2017-09-07 DIAGNOSIS — E039 Hypothyroidism, unspecified: Secondary | ICD-10-CM | POA: Diagnosis not present

## 2017-09-07 DIAGNOSIS — R1312 Dysphagia, oropharyngeal phase: Secondary | ICD-10-CM | POA: Diagnosis not present

## 2017-09-07 DIAGNOSIS — K219 Gastro-esophageal reflux disease without esophagitis: Secondary | ICD-10-CM | POA: Diagnosis not present

## 2017-09-07 DIAGNOSIS — S91009A Unspecified open wound, unspecified ankle, initial encounter: Secondary | ICD-10-CM | POA: Diagnosis not present

## 2017-09-07 DIAGNOSIS — M6281 Muscle weakness (generalized): Secondary | ICD-10-CM | POA: Diagnosis not present

## 2017-09-08 DIAGNOSIS — R293 Abnormal posture: Secondary | ICD-10-CM | POA: Diagnosis not present

## 2017-09-08 DIAGNOSIS — M6281 Muscle weakness (generalized): Secondary | ICD-10-CM | POA: Diagnosis not present

## 2017-09-08 DIAGNOSIS — A498 Other bacterial infections of unspecified site: Secondary | ICD-10-CM | POA: Diagnosis not present

## 2017-09-09 DIAGNOSIS — M6281 Muscle weakness (generalized): Secondary | ICD-10-CM | POA: Diagnosis not present

## 2017-09-09 DIAGNOSIS — R293 Abnormal posture: Secondary | ICD-10-CM | POA: Diagnosis not present

## 2017-09-10 DIAGNOSIS — R0602 Shortness of breath: Secondary | ICD-10-CM | POA: Diagnosis not present

## 2017-09-11 DIAGNOSIS — M6281 Muscle weakness (generalized): Secondary | ICD-10-CM | POA: Diagnosis not present

## 2017-09-11 DIAGNOSIS — R1310 Dysphagia, unspecified: Secondary | ICD-10-CM | POA: Diagnosis not present

## 2017-09-11 DIAGNOSIS — R293 Abnormal posture: Secondary | ICD-10-CM | POA: Diagnosis not present

## 2017-09-12 DIAGNOSIS — M6281 Muscle weakness (generalized): Secondary | ICD-10-CM | POA: Diagnosis not present

## 2017-09-12 DIAGNOSIS — R1310 Dysphagia, unspecified: Secondary | ICD-10-CM | POA: Diagnosis not present

## 2017-09-12 DIAGNOSIS — R293 Abnormal posture: Secondary | ICD-10-CM | POA: Diagnosis not present

## 2017-09-13 DIAGNOSIS — R293 Abnormal posture: Secondary | ICD-10-CM | POA: Diagnosis not present

## 2017-09-13 DIAGNOSIS — R1310 Dysphagia, unspecified: Secondary | ICD-10-CM | POA: Diagnosis not present

## 2017-09-13 DIAGNOSIS — M6281 Muscle weakness (generalized): Secondary | ICD-10-CM | POA: Diagnosis not present

## 2017-09-14 DIAGNOSIS — R1310 Dysphagia, unspecified: Secondary | ICD-10-CM | POA: Diagnosis not present

## 2017-09-14 DIAGNOSIS — R293 Abnormal posture: Secondary | ICD-10-CM | POA: Diagnosis not present

## 2017-09-14 DIAGNOSIS — K219 Gastro-esophageal reflux disease without esophagitis: Secondary | ICD-10-CM | POA: Diagnosis not present

## 2017-09-14 DIAGNOSIS — R1312 Dysphagia, oropharyngeal phase: Secondary | ICD-10-CM | POA: Diagnosis not present

## 2017-09-14 DIAGNOSIS — S91009A Unspecified open wound, unspecified ankle, initial encounter: Secondary | ICD-10-CM | POA: Diagnosis not present

## 2017-09-14 DIAGNOSIS — M6281 Muscle weakness (generalized): Secondary | ICD-10-CM | POA: Diagnosis not present

## 2017-09-14 DIAGNOSIS — E039 Hypothyroidism, unspecified: Secondary | ICD-10-CM | POA: Diagnosis not present

## 2017-09-15 DIAGNOSIS — R293 Abnormal posture: Secondary | ICD-10-CM | POA: Diagnosis not present

## 2017-09-15 DIAGNOSIS — R1310 Dysphagia, unspecified: Secondary | ICD-10-CM | POA: Diagnosis not present

## 2017-09-15 DIAGNOSIS — M6281 Muscle weakness (generalized): Secondary | ICD-10-CM | POA: Diagnosis not present

## 2017-09-16 DIAGNOSIS — R293 Abnormal posture: Secondary | ICD-10-CM | POA: Diagnosis not present

## 2017-09-16 DIAGNOSIS — R1310 Dysphagia, unspecified: Secondary | ICD-10-CM | POA: Diagnosis not present

## 2017-09-16 DIAGNOSIS — M6281 Muscle weakness (generalized): Secondary | ICD-10-CM | POA: Diagnosis not present

## 2017-09-17 DIAGNOSIS — R1310 Dysphagia, unspecified: Secondary | ICD-10-CM | POA: Diagnosis not present

## 2017-09-17 DIAGNOSIS — M6281 Muscle weakness (generalized): Secondary | ICD-10-CM | POA: Diagnosis not present

## 2017-09-17 DIAGNOSIS — R293 Abnormal posture: Secondary | ICD-10-CM | POA: Diagnosis not present

## 2017-09-20 DIAGNOSIS — R293 Abnormal posture: Secondary | ICD-10-CM | POA: Diagnosis not present

## 2017-09-20 DIAGNOSIS — M6281 Muscle weakness (generalized): Secondary | ICD-10-CM | POA: Diagnosis not present

## 2017-09-20 DIAGNOSIS — R1310 Dysphagia, unspecified: Secondary | ICD-10-CM | POA: Diagnosis not present

## 2017-09-21 DIAGNOSIS — R293 Abnormal posture: Secondary | ICD-10-CM | POA: Diagnosis not present

## 2017-09-21 DIAGNOSIS — R1310 Dysphagia, unspecified: Secondary | ICD-10-CM | POA: Diagnosis not present

## 2017-09-21 DIAGNOSIS — M6281 Muscle weakness (generalized): Secondary | ICD-10-CM | POA: Diagnosis not present

## 2017-09-22 DIAGNOSIS — M6281 Muscle weakness (generalized): Secondary | ICD-10-CM | POA: Diagnosis not present

## 2017-09-22 DIAGNOSIS — R293 Abnormal posture: Secondary | ICD-10-CM | POA: Diagnosis not present

## 2017-09-22 DIAGNOSIS — R1310 Dysphagia, unspecified: Secondary | ICD-10-CM | POA: Diagnosis not present

## 2017-09-23 DIAGNOSIS — R293 Abnormal posture: Secondary | ICD-10-CM | POA: Diagnosis not present

## 2017-09-23 DIAGNOSIS — R1310 Dysphagia, unspecified: Secondary | ICD-10-CM | POA: Diagnosis not present

## 2017-09-23 DIAGNOSIS — M6281 Muscle weakness (generalized): Secondary | ICD-10-CM | POA: Diagnosis not present

## 2017-09-24 DIAGNOSIS — M6281 Muscle weakness (generalized): Secondary | ICD-10-CM | POA: Diagnosis not present

## 2017-09-24 DIAGNOSIS — R1310 Dysphagia, unspecified: Secondary | ICD-10-CM | POA: Diagnosis not present

## 2017-09-24 DIAGNOSIS — R293 Abnormal posture: Secondary | ICD-10-CM | POA: Diagnosis not present

## 2017-09-27 DIAGNOSIS — R293 Abnormal posture: Secondary | ICD-10-CM | POA: Diagnosis not present

## 2017-09-27 DIAGNOSIS — M6281 Muscle weakness (generalized): Secondary | ICD-10-CM | POA: Diagnosis not present

## 2017-09-27 DIAGNOSIS — R1310 Dysphagia, unspecified: Secondary | ICD-10-CM | POA: Diagnosis not present

## 2017-09-28 DIAGNOSIS — R1310 Dysphagia, unspecified: Secondary | ICD-10-CM | POA: Diagnosis not present

## 2017-09-28 DIAGNOSIS — M6281 Muscle weakness (generalized): Secondary | ICD-10-CM | POA: Diagnosis not present

## 2017-09-28 DIAGNOSIS — R293 Abnormal posture: Secondary | ICD-10-CM | POA: Diagnosis not present

## 2017-09-29 DIAGNOSIS — M6281 Muscle weakness (generalized): Secondary | ICD-10-CM | POA: Diagnosis not present

## 2017-09-29 DIAGNOSIS — R1310 Dysphagia, unspecified: Secondary | ICD-10-CM | POA: Diagnosis not present

## 2017-09-29 DIAGNOSIS — R293 Abnormal posture: Secondary | ICD-10-CM | POA: Diagnosis not present

## 2017-09-30 DIAGNOSIS — R1310 Dysphagia, unspecified: Secondary | ICD-10-CM | POA: Diagnosis not present

## 2017-09-30 DIAGNOSIS — M6281 Muscle weakness (generalized): Secondary | ICD-10-CM | POA: Diagnosis not present

## 2017-09-30 DIAGNOSIS — R293 Abnormal posture: Secondary | ICD-10-CM | POA: Diagnosis not present

## 2017-10-01 DIAGNOSIS — M6281 Muscle weakness (generalized): Secondary | ICD-10-CM | POA: Diagnosis not present

## 2017-10-01 DIAGNOSIS — R293 Abnormal posture: Secondary | ICD-10-CM | POA: Diagnosis not present

## 2017-10-01 DIAGNOSIS — R1310 Dysphagia, unspecified: Secondary | ICD-10-CM | POA: Diagnosis not present

## 2017-10-02 DIAGNOSIS — R293 Abnormal posture: Secondary | ICD-10-CM | POA: Diagnosis not present

## 2017-10-02 DIAGNOSIS — R1310 Dysphagia, unspecified: Secondary | ICD-10-CM | POA: Diagnosis not present

## 2017-10-02 DIAGNOSIS — M6281 Muscle weakness (generalized): Secondary | ICD-10-CM | POA: Diagnosis not present

## 2017-10-04 DIAGNOSIS — M79604 Pain in right leg: Secondary | ICD-10-CM | POA: Diagnosis not present

## 2017-10-04 DIAGNOSIS — R1310 Dysphagia, unspecified: Secondary | ICD-10-CM | POA: Diagnosis not present

## 2017-10-04 DIAGNOSIS — R293 Abnormal posture: Secondary | ICD-10-CM | POA: Diagnosis not present

## 2017-10-04 DIAGNOSIS — R600 Localized edema: Secondary | ICD-10-CM | POA: Diagnosis not present

## 2017-10-04 DIAGNOSIS — M6281 Muscle weakness (generalized): Secondary | ICD-10-CM | POA: Diagnosis not present

## 2017-10-05 DIAGNOSIS — R293 Abnormal posture: Secondary | ICD-10-CM | POA: Diagnosis not present

## 2017-10-05 DIAGNOSIS — M6281 Muscle weakness (generalized): Secondary | ICD-10-CM | POA: Diagnosis not present

## 2017-10-05 DIAGNOSIS — R1310 Dysphagia, unspecified: Secondary | ICD-10-CM | POA: Diagnosis not present

## 2017-10-06 DIAGNOSIS — M6281 Muscle weakness (generalized): Secondary | ICD-10-CM | POA: Diagnosis not present

## 2017-10-06 DIAGNOSIS — R293 Abnormal posture: Secondary | ICD-10-CM | POA: Diagnosis not present

## 2017-10-06 DIAGNOSIS — R1310 Dysphagia, unspecified: Secondary | ICD-10-CM | POA: Diagnosis not present

## 2017-10-07 DIAGNOSIS — R1310 Dysphagia, unspecified: Secondary | ICD-10-CM | POA: Diagnosis not present

## 2017-10-07 DIAGNOSIS — M6281 Muscle weakness (generalized): Secondary | ICD-10-CM | POA: Diagnosis not present

## 2017-10-07 DIAGNOSIS — R293 Abnormal posture: Secondary | ICD-10-CM | POA: Diagnosis not present

## 2017-10-08 DIAGNOSIS — R1310 Dysphagia, unspecified: Secondary | ICD-10-CM | POA: Diagnosis not present

## 2017-10-08 DIAGNOSIS — M6281 Muscle weakness (generalized): Secondary | ICD-10-CM | POA: Diagnosis not present

## 2017-10-08 DIAGNOSIS — R293 Abnormal posture: Secondary | ICD-10-CM | POA: Diagnosis not present

## 2017-10-11 DIAGNOSIS — M6281 Muscle weakness (generalized): Secondary | ICD-10-CM | POA: Diagnosis not present

## 2017-10-11 DIAGNOSIS — R293 Abnormal posture: Secondary | ICD-10-CM | POA: Diagnosis not present

## 2017-10-12 DIAGNOSIS — R293 Abnormal posture: Secondary | ICD-10-CM | POA: Diagnosis not present

## 2017-10-12 DIAGNOSIS — M6281 Muscle weakness (generalized): Secondary | ICD-10-CM | POA: Diagnosis not present

## 2017-10-13 DIAGNOSIS — M6281 Muscle weakness (generalized): Secondary | ICD-10-CM | POA: Diagnosis not present

## 2017-10-13 DIAGNOSIS — R293 Abnormal posture: Secondary | ICD-10-CM | POA: Diagnosis not present

## 2017-10-14 DIAGNOSIS — R600 Localized edema: Secondary | ICD-10-CM | POA: Diagnosis not present

## 2017-10-14 DIAGNOSIS — R293 Abnormal posture: Secondary | ICD-10-CM | POA: Diagnosis not present

## 2017-10-14 DIAGNOSIS — M6281 Muscle weakness (generalized): Secondary | ICD-10-CM | POA: Diagnosis not present

## 2017-10-14 DIAGNOSIS — E119 Type 2 diabetes mellitus without complications: Secondary | ICD-10-CM | POA: Diagnosis not present

## 2017-10-14 DIAGNOSIS — I739 Peripheral vascular disease, unspecified: Secondary | ICD-10-CM | POA: Diagnosis not present

## 2017-10-14 DIAGNOSIS — L97301 Non-pressure chronic ulcer of unspecified ankle limited to breakdown of skin: Secondary | ICD-10-CM | POA: Diagnosis not present

## 2017-10-15 DIAGNOSIS — M6281 Muscle weakness (generalized): Secondary | ICD-10-CM | POA: Diagnosis not present

## 2017-10-15 DIAGNOSIS — R293 Abnormal posture: Secondary | ICD-10-CM | POA: Diagnosis not present

## 2017-10-18 DIAGNOSIS — R293 Abnormal posture: Secondary | ICD-10-CM | POA: Diagnosis not present

## 2017-10-18 DIAGNOSIS — M6281 Muscle weakness (generalized): Secondary | ICD-10-CM | POA: Diagnosis not present

## 2017-10-19 DIAGNOSIS — R293 Abnormal posture: Secondary | ICD-10-CM | POA: Diagnosis not present

## 2017-10-19 DIAGNOSIS — M6281 Muscle weakness (generalized): Secondary | ICD-10-CM | POA: Diagnosis not present

## 2017-10-20 DIAGNOSIS — M6281 Muscle weakness (generalized): Secondary | ICD-10-CM | POA: Diagnosis not present

## 2017-10-20 DIAGNOSIS — R293 Abnormal posture: Secondary | ICD-10-CM | POA: Diagnosis not present

## 2017-10-21 ENCOUNTER — Ambulatory Visit: Payer: Medicare Other

## 2017-10-21 DIAGNOSIS — R293 Abnormal posture: Secondary | ICD-10-CM | POA: Diagnosis not present

## 2017-10-21 DIAGNOSIS — M6281 Muscle weakness (generalized): Secondary | ICD-10-CM | POA: Diagnosis not present

## 2017-10-22 DIAGNOSIS — M6281 Muscle weakness (generalized): Secondary | ICD-10-CM | POA: Diagnosis not present

## 2017-10-22 DIAGNOSIS — R293 Abnormal posture: Secondary | ICD-10-CM | POA: Diagnosis not present

## 2017-10-26 DIAGNOSIS — R293 Abnormal posture: Secondary | ICD-10-CM | POA: Diagnosis not present

## 2017-10-26 DIAGNOSIS — M6281 Muscle weakness (generalized): Secondary | ICD-10-CM | POA: Diagnosis not present

## 2017-10-27 DIAGNOSIS — R293 Abnormal posture: Secondary | ICD-10-CM | POA: Diagnosis not present

## 2017-10-27 DIAGNOSIS — M6281 Muscle weakness (generalized): Secondary | ICD-10-CM | POA: Diagnosis not present

## 2017-10-27 DIAGNOSIS — E1051 Type 1 diabetes mellitus with diabetic peripheral angiopathy without gangrene: Secondary | ICD-10-CM | POA: Diagnosis not present

## 2017-10-27 DIAGNOSIS — B351 Tinea unguium: Secondary | ICD-10-CM | POA: Diagnosis not present

## 2017-10-28 DIAGNOSIS — R293 Abnormal posture: Secondary | ICD-10-CM | POA: Diagnosis not present

## 2017-10-28 DIAGNOSIS — M6281 Muscle weakness (generalized): Secondary | ICD-10-CM | POA: Diagnosis not present

## 2017-10-29 DIAGNOSIS — M6281 Muscle weakness (generalized): Secondary | ICD-10-CM | POA: Diagnosis not present

## 2017-10-29 DIAGNOSIS — R293 Abnormal posture: Secondary | ICD-10-CM | POA: Diagnosis not present

## 2017-10-30 DIAGNOSIS — M6281 Muscle weakness (generalized): Secondary | ICD-10-CM | POA: Diagnosis not present

## 2017-10-30 DIAGNOSIS — R293 Abnormal posture: Secondary | ICD-10-CM | POA: Diagnosis not present

## 2017-11-01 ENCOUNTER — Encounter (HOSPITAL_BASED_OUTPATIENT_CLINIC_OR_DEPARTMENT_OTHER): Payer: Medicare Other | Attending: Internal Medicine

## 2017-11-01 DIAGNOSIS — E119 Type 2 diabetes mellitus without complications: Secondary | ICD-10-CM | POA: Diagnosis not present

## 2017-11-01 DIAGNOSIS — I10 Essential (primary) hypertension: Secondary | ICD-10-CM | POA: Insufficient documentation

## 2017-11-01 DIAGNOSIS — Z7984 Long term (current) use of oral hypoglycemic drugs: Secondary | ICD-10-CM | POA: Diagnosis not present

## 2017-11-01 DIAGNOSIS — Q858 Other phakomatoses, not elsewhere classified: Secondary | ICD-10-CM | POA: Diagnosis not present

## 2017-11-01 DIAGNOSIS — E11622 Type 2 diabetes mellitus with other skin ulcer: Secondary | ICD-10-CM | POA: Diagnosis not present

## 2017-11-01 DIAGNOSIS — M25571 Pain in right ankle and joints of right foot: Secondary | ICD-10-CM | POA: Diagnosis not present

## 2017-11-01 DIAGNOSIS — L89512 Pressure ulcer of right ankle, stage 2: Secondary | ICD-10-CM | POA: Insufficient documentation

## 2017-11-01 DIAGNOSIS — L97312 Non-pressure chronic ulcer of right ankle with fat layer exposed: Secondary | ICD-10-CM | POA: Diagnosis not present

## 2017-11-02 DIAGNOSIS — M6281 Muscle weakness (generalized): Secondary | ICD-10-CM | POA: Diagnosis not present

## 2017-11-02 DIAGNOSIS — Z79899 Other long term (current) drug therapy: Secondary | ICD-10-CM | POA: Diagnosis not present

## 2017-11-02 DIAGNOSIS — E039 Hypothyroidism, unspecified: Secondary | ICD-10-CM | POA: Diagnosis not present

## 2017-11-02 DIAGNOSIS — R293 Abnormal posture: Secondary | ICD-10-CM | POA: Diagnosis not present

## 2017-11-02 DIAGNOSIS — E782 Mixed hyperlipidemia: Secondary | ICD-10-CM | POA: Diagnosis not present

## 2017-11-02 DIAGNOSIS — D649 Anemia, unspecified: Secondary | ICD-10-CM | POA: Diagnosis not present

## 2017-11-02 DIAGNOSIS — E119 Type 2 diabetes mellitus without complications: Secondary | ICD-10-CM | POA: Diagnosis not present

## 2017-11-03 DIAGNOSIS — M6281 Muscle weakness (generalized): Secondary | ICD-10-CM | POA: Diagnosis not present

## 2017-11-03 DIAGNOSIS — R293 Abnormal posture: Secondary | ICD-10-CM | POA: Diagnosis not present

## 2017-11-04 DIAGNOSIS — M6281 Muscle weakness (generalized): Secondary | ICD-10-CM | POA: Diagnosis not present

## 2017-11-04 DIAGNOSIS — R293 Abnormal posture: Secondary | ICD-10-CM | POA: Diagnosis not present

## 2017-11-06 DIAGNOSIS — R293 Abnormal posture: Secondary | ICD-10-CM | POA: Diagnosis not present

## 2017-11-06 DIAGNOSIS — M6281 Muscle weakness (generalized): Secondary | ICD-10-CM | POA: Diagnosis not present

## 2017-11-07 DIAGNOSIS — M6281 Muscle weakness (generalized): Secondary | ICD-10-CM | POA: Diagnosis not present

## 2017-11-07 DIAGNOSIS — R293 Abnormal posture: Secondary | ICD-10-CM | POA: Diagnosis not present

## 2017-11-08 DIAGNOSIS — M6281 Muscle weakness (generalized): Secondary | ICD-10-CM | POA: Diagnosis not present

## 2017-11-08 DIAGNOSIS — R293 Abnormal posture: Secondary | ICD-10-CM | POA: Diagnosis not present

## 2017-11-09 DIAGNOSIS — M6281 Muscle weakness (generalized): Secondary | ICD-10-CM | POA: Diagnosis not present

## 2017-11-09 DIAGNOSIS — R293 Abnormal posture: Secondary | ICD-10-CM | POA: Diagnosis not present

## 2017-11-10 DIAGNOSIS — R293 Abnormal posture: Secondary | ICD-10-CM | POA: Diagnosis not present

## 2017-11-10 DIAGNOSIS — M6281 Muscle weakness (generalized): Secondary | ICD-10-CM | POA: Diagnosis not present

## 2017-11-11 DIAGNOSIS — M6281 Muscle weakness (generalized): Secondary | ICD-10-CM | POA: Diagnosis not present

## 2017-11-16 ENCOUNTER — Encounter (HOSPITAL_BASED_OUTPATIENT_CLINIC_OR_DEPARTMENT_OTHER): Payer: Medicare Other | Attending: Internal Medicine

## 2017-11-16 DIAGNOSIS — L89512 Pressure ulcer of right ankle, stage 2: Secondary | ICD-10-CM | POA: Diagnosis not present

## 2017-11-16 DIAGNOSIS — S91001A Unspecified open wound, right ankle, initial encounter: Secondary | ICD-10-CM | POA: Diagnosis not present

## 2017-11-16 DIAGNOSIS — Q859 Phakomatosis, unspecified: Secondary | ICD-10-CM | POA: Insufficient documentation

## 2017-11-16 DIAGNOSIS — I10 Essential (primary) hypertension: Secondary | ICD-10-CM | POA: Diagnosis not present

## 2017-11-16 DIAGNOSIS — F79 Unspecified intellectual disabilities: Secondary | ICD-10-CM | POA: Diagnosis not present

## 2017-11-16 DIAGNOSIS — E119 Type 2 diabetes mellitus without complications: Secondary | ICD-10-CM | POA: Diagnosis not present

## 2017-11-25 DIAGNOSIS — I10 Essential (primary) hypertension: Secondary | ICD-10-CM | POA: Diagnosis not present

## 2017-11-25 DIAGNOSIS — R269 Unspecified abnormalities of gait and mobility: Secondary | ICD-10-CM | POA: Diagnosis not present

## 2017-11-25 DIAGNOSIS — G40909 Epilepsy, unspecified, not intractable, without status epilepticus: Secondary | ICD-10-CM | POA: Diagnosis not present

## 2017-11-25 DIAGNOSIS — R41841 Cognitive communication deficit: Secondary | ICD-10-CM | POA: Diagnosis not present

## 2017-12-01 DIAGNOSIS — I739 Peripheral vascular disease, unspecified: Secondary | ICD-10-CM | POA: Diagnosis not present

## 2017-12-01 DIAGNOSIS — E119 Type 2 diabetes mellitus without complications: Secondary | ICD-10-CM | POA: Diagnosis not present

## 2017-12-01 DIAGNOSIS — R600 Localized edema: Secondary | ICD-10-CM | POA: Diagnosis not present

## 2017-12-01 DIAGNOSIS — L97301 Non-pressure chronic ulcer of unspecified ankle limited to breakdown of skin: Secondary | ICD-10-CM | POA: Diagnosis not present

## 2017-12-06 DIAGNOSIS — I10 Essential (primary) hypertension: Secondary | ICD-10-CM | POA: Diagnosis not present

## 2017-12-06 DIAGNOSIS — L97312 Non-pressure chronic ulcer of right ankle with fat layer exposed: Secondary | ICD-10-CM | POA: Diagnosis not present

## 2017-12-06 DIAGNOSIS — E119 Type 2 diabetes mellitus without complications: Secondary | ICD-10-CM | POA: Diagnosis not present

## 2017-12-06 DIAGNOSIS — F79 Unspecified intellectual disabilities: Secondary | ICD-10-CM | POA: Diagnosis not present

## 2017-12-06 DIAGNOSIS — L89512 Pressure ulcer of right ankle, stage 2: Secondary | ICD-10-CM | POA: Diagnosis not present

## 2017-12-06 DIAGNOSIS — Q859 Phakomatosis, unspecified: Secondary | ICD-10-CM | POA: Diagnosis not present

## 2017-12-06 DIAGNOSIS — D649 Anemia, unspecified: Secondary | ICD-10-CM | POA: Diagnosis not present

## 2017-12-06 DIAGNOSIS — E11622 Type 2 diabetes mellitus with other skin ulcer: Secondary | ICD-10-CM | POA: Diagnosis not present

## 2017-12-15 DIAGNOSIS — G40909 Epilepsy, unspecified, not intractable, without status epilepticus: Secondary | ICD-10-CM | POA: Diagnosis not present

## 2017-12-15 DIAGNOSIS — K219 Gastro-esophageal reflux disease without esophagitis: Secondary | ICD-10-CM | POA: Diagnosis not present

## 2017-12-15 DIAGNOSIS — E559 Vitamin D deficiency, unspecified: Secondary | ICD-10-CM | POA: Diagnosis not present

## 2017-12-15 DIAGNOSIS — E119 Type 2 diabetes mellitus without complications: Secondary | ICD-10-CM | POA: Diagnosis not present

## 2017-12-20 ENCOUNTER — Encounter (HOSPITAL_BASED_OUTPATIENT_CLINIC_OR_DEPARTMENT_OTHER): Payer: Medicare Other | Attending: Internal Medicine

## 2017-12-20 DIAGNOSIS — E1151 Type 2 diabetes mellitus with diabetic peripheral angiopathy without gangrene: Secondary | ICD-10-CM | POA: Insufficient documentation

## 2017-12-20 DIAGNOSIS — Q859 Phakomatosis, unspecified: Secondary | ICD-10-CM | POA: Insufficient documentation

## 2017-12-20 DIAGNOSIS — E11621 Type 2 diabetes mellitus with foot ulcer: Secondary | ICD-10-CM | POA: Diagnosis not present

## 2017-12-20 DIAGNOSIS — L97312 Non-pressure chronic ulcer of right ankle with fat layer exposed: Secondary | ICD-10-CM | POA: Diagnosis not present

## 2017-12-20 DIAGNOSIS — I10 Essential (primary) hypertension: Secondary | ICD-10-CM | POA: Insufficient documentation

## 2017-12-20 DIAGNOSIS — L89512 Pressure ulcer of right ankle, stage 2: Secondary | ICD-10-CM | POA: Diagnosis not present

## 2017-12-20 DIAGNOSIS — E11622 Type 2 diabetes mellitus with other skin ulcer: Secondary | ICD-10-CM | POA: Diagnosis not present

## 2017-12-20 DIAGNOSIS — L97512 Non-pressure chronic ulcer of other part of right foot with fat layer exposed: Secondary | ICD-10-CM | POA: Diagnosis not present

## 2017-12-20 DIAGNOSIS — L97511 Non-pressure chronic ulcer of other part of right foot limited to breakdown of skin: Secondary | ICD-10-CM | POA: Insufficient documentation

## 2017-12-21 DIAGNOSIS — E039 Hypothyroidism, unspecified: Secondary | ICD-10-CM | POA: Diagnosis not present

## 2017-12-31 ENCOUNTER — Telehealth: Payer: Self-pay | Admitting: *Deleted

## 2017-12-31 ENCOUNTER — Ambulatory Visit (INDEPENDENT_AMBULATORY_CARE_PROVIDER_SITE_OTHER): Payer: Medicare Other | Admitting: Gastroenterology

## 2017-12-31 ENCOUNTER — Encounter: Payer: Self-pay | Admitting: Gastroenterology

## 2017-12-31 VITALS — BP 136/77 | HR 50

## 2017-12-31 DIAGNOSIS — Z86010 Personal history of colonic polyps: Secondary | ICD-10-CM | POA: Diagnosis not present

## 2017-12-31 NOTE — Progress Notes (Signed)
Referring Provider: Monico Blitz, MD Primary Care Physician:  Monico Blitz, MD Primary GI: Dr. Gala Romney   Chief Complaint  Patient presents with  . Colonoscopy    2 year TCS    HPI:   Gary Richard is a 62 y.o. male presenting today with a history of Sturge-Weber syndrome. GI history significant for esophagitis, gastritis, need for early interval 2-year-surveillance colonoscopy due to sessile serrated adenoma and tubular adenoma on colonoscopy 2017.   Mildly isolated elevation of alk phos historically. GGT normal at 8. AMA, ANA,  ASMA all negative. Overdue for repeat HFP.   He is a limited historian but very jovial. Laughing intermittently. Denies abdominal pain, N/V. Caretaker states he has a good appetite. No lower GI issues.   Past Medical History:  Diagnosis Date  . DM (diabetes mellitus) ([place])   . Gout   . HTN (hypertension)   . Hypertriglyceridemia   . Mental retardation   . Sturge-Weber syndrome [facility])     Past Surgical History:  Procedure Laterality Date  . COLONOSCOPY WITH PROPOFOL N/A 09/30/2015   RMR: Diverticulosis in the entire colon, 2 polyps removed, pathology with sessile serrated adenoma with cytologic dysplasia.Marland Kitchen Next colonoscopy recommended for June 2019  . ESOPHAGOGASTRODUODENOSCOPY (EGD) WITH PROPOFOL N/A 09/30/2015   RMR: LA grade a esophagitis, gastritis, pathology with focal chronic inactive gastritis, no H pylori.  Marland Kitchen PORTACATH PLACEMENT Right    years ago  . TONGUE SURGERY      Current Outpatient Medications  Medication Sig Dispense Refill  . metFORMIN (GLUCOPHAGE-XR) 500 MG 24 hr tablet Take 500 mg by mouth daily with breakfast.    . amLODipine (NORVASC) 5 MG tablet Take 5 mg by mouth daily.    Marland Kitchen aspirin 81 MG tablet Take 81 mg by mouth daily.    Marland Kitchen gemfibrozil (LOPID) 600 MG tablet Take 600 mg by mouth 2 (two) times daily before a meal.    . hydrochlorothiazide (MICROZIDE) 12.5 MG capsule Take 12.5 mg by mouth daily.    . pravastatin  (PRAVACHOL) 10 MG tablet Take 10 mg by mouth daily.     No current facility-administered medications for this visit.     Allergies as of 12/31/2017  . (No Known Allergies)    Family History  Problem Relation Age of Onset  . Other Unknown        unknown    Social History   Socioeconomic History  . Marital status: Unknown    Spouse name: Not on file  . Number of children: Not on file  . Years of education: Not on file  . Highest education level: Not on file  Occupational History  . Not on file  Social Needs  . Financial resource strain: Not on file  . Food insecurity:    Worry: Not on file    Inability: Not on file  . Transportation needs:    Medical: Not on file    Non-medical: Not on file  Tobacco Use  . Smoking status: Never Smoker  . Smokeless tobacco: Never Used  Substance and Sexual Activity  . Alcohol use: No    Alcohol/week: 0.0 standard drinks  . Drug use: No  . Sexual activity: Not on file  Lifestyle  . Physical activity:    Days per week: Not on file    Minutes per session: Not on file  . Stress: Not on file  Relationships  . Social connections:    Talks on phone: Not on file  Gets together: Not on file    Attends religious service: Not on file    Active member of club or organization: Not on file    Attends meetings of clubs or organizations: Not on file    Relationship status: Not on file  Other Topics Concern  . Not on file  Social History Narrative   Lives in same group home over 30 years.     Review of Systems: Limited due to cognitive status.   Physical Exam: BP 136/77   Pulse (!) 50  General:   Alert, laughs jovially at questions. Limited historian. Portwine stain involving much of left side of face with enlargement of left side of face/mouth/lips. Limited verbally in setting of mentally handicapped.  Mouth:  Oral mucosa pink and moist. Poor dentition Lungs: CTAB Cardiac: S1 S2 present without obvious murmurs  Abdomen:  +BS,  soft, non-tender and non-distended. No rebound or guarding. No HSM or masses noted. Limited exam with patient sitting in wheelchair.  Msk:  Symmetrical without gross deformities. Normal posture. Extremities:  1+ edema bilateral lower extremities  Neurologic:  Alert and  oriented x4

## 2017-12-31 NOTE — Patient Instructions (Signed)
We have scheduled you for a colonoscopy with Dr. Gala Romney in the near future.  Do not take metformin on the day of the procedure.  You will need your legal guardian with you that day.  It was a pleasure to see you today. I strive to create trusting relationships with patients to provide genuine, compassionate, and quality care. I value your feedback. If you receive a survey regarding your visit,  I greatly appreciate you taking time to fill this out.   Annitta Needs, PhD, ANP-BC [facility]

## 2017-12-31 NOTE — Telephone Encounter (Signed)
I will have to order it and let endo know to make sure they draw the lab

## 2017-12-31 NOTE — Telephone Encounter (Signed)
[facility] to schedule TCS w/ MAC W/ RMR. Was advised I needed to speak with Arbie Cookey and she is not in today. Will call back next week to schedule. Also per AB patient will need legal guardian present for TCS.

## 2017-12-31 NOTE — Telephone Encounter (Signed)
Gary Richard: due to history of elevated alk phos, can preadmission order an HFP during preop labs?

## 2018-01-03 ENCOUNTER — Other Ambulatory Visit: Payer: Self-pay | Admitting: *Deleted

## 2018-01-03 DIAGNOSIS — Z86010 Personal history of colonic polyps: Secondary | ICD-10-CM

## 2018-01-03 MED ORDER — PEG 3350-KCL-NA BICARB-NACL 420 G PO SOLR: 4000.0000 mL | Freq: Once | ORAL | 0 refills | Status: AC

## 2018-01-03 NOTE — Telephone Encounter (Signed)
Pre-op scheduled for 01/28/18 at 10:00am. Letter faxed to [facility].

## 2018-01-03 NOTE — Telephone Encounter (Signed)
Spoke with Arbie Cookey from [facility]. Procedure scheduled for 02/03/18 at 9:30am. She requests I fax her the instructions for prep to her at [phone]. She is also aware I will fax pre-op letter as well.

## 2018-01-07 DIAGNOSIS — I10 Essential (primary) hypertension: Secondary | ICD-10-CM | POA: Diagnosis not present

## 2018-01-07 DIAGNOSIS — E11628 Type 2 diabetes mellitus with other skin complications: Secondary | ICD-10-CM | POA: Diagnosis not present

## 2018-01-07 DIAGNOSIS — L97511 Non-pressure chronic ulcer of other part of right foot limited to breakdown of skin: Secondary | ICD-10-CM | POA: Diagnosis not present

## 2018-01-07 DIAGNOSIS — Q859 Phakomatosis, unspecified: Secondary | ICD-10-CM | POA: Diagnosis not present

## 2018-01-07 DIAGNOSIS — E11621 Type 2 diabetes mellitus with foot ulcer: Secondary | ICD-10-CM | POA: Diagnosis not present

## 2018-01-07 DIAGNOSIS — E1151 Type 2 diabetes mellitus with diabetic peripheral angiopathy without gangrene: Secondary | ICD-10-CM | POA: Diagnosis not present

## 2018-01-07 DIAGNOSIS — L89512 Pressure ulcer of right ankle, stage 2: Secondary | ICD-10-CM | POA: Diagnosis not present

## 2018-01-07 DIAGNOSIS — L89893 Pressure ulcer of other site, stage 3: Secondary | ICD-10-CM | POA: Diagnosis not present

## 2018-01-07 DIAGNOSIS — S91001A Unspecified open wound, right ankle, initial encounter: Secondary | ICD-10-CM | POA: Diagnosis not present

## 2018-01-09 NOTE — Assessment & Plan Note (Signed)
62 year old male with history of sessile serrated adenoma and tubular adenoma on colonoscopy in 2017. History of Sturge-Weber syndrome, mentally handicapped, limited historian. Per caregiver, no significant upper or lower GI signs/symptoms. Very jovial and laughs to respond to questions. Will need legal guardian present at time of colonoscopy, which I have made note of with schedulers.   Proceed with TCS with Dr. Gala Romney in near future: the risks, benefits, and alternatives have been discussed with the patient in detail. The patient states understanding and desires to proceed. Propofol Will ask that HFP be ordered during pre-admission testing with history of mildly isolated elevated alk phos historically. GGT normal, AMA, ANA, ASMA all negative.

## 2018-01-10 NOTE — Progress Notes (Signed)
CC'D TO PCP °

## 2018-01-18 DIAGNOSIS — M109 Gout, unspecified: Secondary | ICD-10-CM | POA: Diagnosis not present

## 2018-01-18 DIAGNOSIS — F79 Unspecified intellectual disabilities: Secondary | ICD-10-CM | POA: Diagnosis not present

## 2018-01-18 DIAGNOSIS — R1312 Dysphagia, oropharyngeal phase: Secondary | ICD-10-CM | POA: Diagnosis not present

## 2018-01-18 DIAGNOSIS — R269 Unspecified abnormalities of gait and mobility: Secondary | ICD-10-CM | POA: Diagnosis not present

## 2018-01-21 ENCOUNTER — Encounter (HOSPITAL_BASED_OUTPATIENT_CLINIC_OR_DEPARTMENT_OTHER): Payer: Medicare Other | Attending: Internal Medicine

## 2018-01-21 DIAGNOSIS — E10621 Type 1 diabetes mellitus with foot ulcer: Secondary | ICD-10-CM | POA: Insufficient documentation

## 2018-01-21 DIAGNOSIS — Q858 Other phakomatoses, not elsewhere classified: Secondary | ICD-10-CM | POA: Diagnosis not present

## 2018-01-21 DIAGNOSIS — I10 Essential (primary) hypertension: Secondary | ICD-10-CM | POA: Diagnosis not present

## 2018-01-21 DIAGNOSIS — E119 Type 2 diabetes mellitus without complications: Secondary | ICD-10-CM | POA: Diagnosis not present

## 2018-01-21 DIAGNOSIS — L89893 Pressure ulcer of other site, stage 3: Secondary | ICD-10-CM | POA: Diagnosis not present

## 2018-01-21 DIAGNOSIS — I739 Peripheral vascular disease, unspecified: Secondary | ICD-10-CM | POA: Diagnosis not present

## 2018-01-21 DIAGNOSIS — E10622 Type 1 diabetes mellitus with other skin ulcer: Secondary | ICD-10-CM | POA: Diagnosis not present

## 2018-01-21 DIAGNOSIS — L97512 Non-pressure chronic ulcer of other part of right foot with fat layer exposed: Secondary | ICD-10-CM | POA: Diagnosis not present

## 2018-01-21 DIAGNOSIS — L97312 Non-pressure chronic ulcer of right ankle with fat layer exposed: Secondary | ICD-10-CM | POA: Diagnosis not present

## 2018-01-21 DIAGNOSIS — S91001A Unspecified open wound, right ankle, initial encounter: Secondary | ICD-10-CM | POA: Diagnosis not present

## 2018-01-21 NOTE — Pre-Procedure Instructions (Signed)
Syndor Settle, Legal guardian in to sign consent for upcoming procedure 02/03/2018 with Dr Gala Romney. He is aware of procedure and also gives permission to do bloodwork the day of his PAT.

## 2018-01-24 NOTE — Patient Instructions (Signed)
Gary Richard  01/24/2018     @PREFPERIOPPHARMACY @   Your procedure is scheduled on  02/03/2018.  Report to [facility] at  745  A.M.  Call this number if you have problems the morning of surgery:  [phone]   Remember:  Do not eat or drink after midnight.  Follow the diet and prep instructions given to you by Dr Roseanne Kaufman office.                      Take these medicines the morning of surgery with A SIP OF WATER Microzide, levothyroxine, prilosec.    Do not wear jewelry, make-up or nail polish.  Do not wear lotions, powders, or perfumes, or deodorant.  Do not shave 48 hours prior to surgery.  Men may shave face and neck.  Do not bring valuables to the hospital.  [facility] is not responsible for any belongings or valuables.  Contacts, dentures or bridgework may not be worn into surgery.  Leave your suitcase in the car.  After surgery it may be brought to your room.  For patients admitted to the hospital, discharge time will be determined by your treatment team.  Patients discharged the day of surgery will not be allowed to drive home.   Name and phone number of your driver:   family Special instructions:  See above.  Please read over the following fact sheets that you were given. Anesthesia Post-op Instructions and Care and Recovery After Surgery       Colonoscopy, Adult A colonoscopy is an exam to look at the large intestine. It is done to check for problems, such as:  Lumps (tumors).  Growths (polyps).  Swelling (inflammation).  Bleeding.  What happens before the procedure? Eating and drinking Follow instructions from your doctor about eating and drinking. These instructions may include:  A few days before the procedure - follow a low-fiber diet. ? Avoid nuts. ? Avoid seeds. ? Avoid dried fruit. ? Avoid raw fruits. ? Avoid vegetables.  1-3 days before the procedure - follow a clear liquid diet. Avoid liquids that have red or purple  dye. Drink only clear liquids, such as: ? Clear broth or bouillon. ? Black coffee or tea. ? Clear juice. ? Clear soft drinks or sports drinks. ? Gelatin dessert. ? Popsicles.  On the day of the procedure - do not eat or drink anything during the 2 hours before the procedure.  Bowel prep If you were prescribed an oral bowel prep:  Take it as told by your doctor. Starting the day before your procedure, you will need to drink a lot of liquid. The liquid will cause you to poop (have bowel movements) until your poop is almost clear or light green.  If your skin or butt gets irritated from diarrhea, you may: ? Wipe the area with wipes that have medicine in them, such as adult wet wipes with aloe and vitamin E. ? Put something on your skin that soothes the area, such as petroleum jelly.  If you throw up (vomit) while drinking the bowel prep, take a break for up to 60 minutes. Then begin the bowel prep again. If you keep throwing up and you cannot take the bowel prep without throwing up, call your doctor.  General instructions  Ask your doctor about changing or stopping your normal medicines. This is important if you take diabetes medicines or blood thinners.  Plan to  have someone take you home from the hospital or clinic. What happens during the procedure?  An IV tube may be put into one of your veins.  You will be given medicine to help you relax (sedative).  To reduce your risk of infection: ? Your doctors will wash their hands. ? Your anal area will be washed with soap.  You will be asked to lie on your side with your knees bent.  Your doctor will get a long, thin, flexible tube ready. The tube will have a camera and a light on the end.  The tube will be put into your anus.  The tube will be gently put into your large intestine.  Air will be delivered into your large intestine to keep it open. You may feel some pressure or cramping.  The camera will be used to take  photos.  A small tissue sample may be removed from your body to be looked at under a microscope (biopsy). If any possible problems are found, the tissue will be sent to a lab for testing.  If small growths are found, your doctor may remove them and have them checked for cancer.  The tube that was put into your anus will be slowly removed. The procedure may vary among doctors and hospitals. What happens after the procedure?  Your doctor will check on you often until the medicines you were given have worn off.  Do not drive for 24 hours after the procedure.  You may have a small amount of blood in your poop.  You may pass gas.  You may have mild cramps or bloating in your belly (abdomen).  It is up to you to get the results of your procedure. Ask your doctor, or the department performing the procedure, when your results will be ready. This information is not intended to replace advice given to you by your health care provider. Make sure you discuss any questions you have with your health care provider. Document Released: 05/02/2010 Document Revised: 01/29/2016 Document Reviewed: 06/11/2015 Elsevier Interactive Patient Education  2017 [employer]  Colonoscopy, Adult, Care After This sheet gives you information about how to care for yourself after your procedure. Your health care provider may also give you more specific instructions. If you have problems or questions, contact your health care provider. What can I expect after the procedure? After the procedure, it is common to have:  A small amount of blood in your stool for 24 hours after the procedure.  Some gas.  Mild abdominal cramping or bloating.  Follow these instructions at home: General instructions   For the first 24 hours after the procedure: ? Do not drive or use machinery. ? Do not sign important documents. ? Do not drink alcohol. ? Do your regular daily activities at a slower pace than normal. ? Eat soft,  easy-to-digest foods. ? Rest often.  Take over-the-counter or prescription medicines only as told by your health care provider.  It is up to you to get the results of your procedure. Ask your health care provider, or the department performing the procedure, when your results will be ready. Relieving cramping and bloating  Try walking around when you have cramps or feel bloated.  Apply heat to your abdomen as told by your health care provider. Use a heat source that your health care provider recommends, such as a moist heat pack or a heating pad. ? Place a towel between your skin and the heat source. ? Leave the heat  on for 20-30 minutes. ? Remove the heat if your skin turns bright red. This is especially important if you are unable to feel pain, heat, or cold. You may have a greater risk of getting burned. Eating and drinking  Drink enough fluid to keep your urine clear or pale yellow.  Resume your normal diet as instructed by your health care provider. Avoid heavy or fried foods that are hard to digest.  Avoid drinking alcohol for as long as instructed by your health care provider. Contact a health care provider if:  You have blood in your stool 2-3 days after the procedure. Get help right away if:  You have more than a small spotting of blood in your stool.  You pass large blood clots in your stool.  Your abdomen is swollen.  You have nausea or vomiting.  You have a fever.  You have increasing abdominal pain that is not relieved with medicine. This information is not intended to replace advice given to you by your health care provider. Make sure you discuss any questions you have with your health care provider. Document Released: 11/12/2003 Document Revised: 12/23/2015 Document Reviewed: 06/11/2015 Elsevier Interactive Patient Education  2018 [place] Anesthesia is a term that refers to techniques, procedures, and medicines that help a  person stay safe and comfortable during a medical procedure. Monitored anesthesia care, or sedation, is one type of anesthesia. Your anesthesia specialist may recommend sedation if you will be having a procedure that does not require you to be unconscious, such as:  Cataract surgery.  A dental procedure.  A biopsy.  A colonoscopy.  During the procedure, you may receive a medicine to help you relax (sedative). There are three levels of sedation:  Mild sedation. At this level, you may feel awake and relaxed. You will be able to follow directions.  Moderate sedation. At this level, you will be sleepy. You may not remember the procedure.  Deep sedation. At this level, you will be asleep. You will not remember the procedure.  The more medicine you are given, the deeper your level of sedation will be. Depending on how you respond to the procedure, the anesthesia specialist may change your level of sedation or the type of anesthesia to fit your needs. An anesthesia specialist will monitor you closely during the procedure. Let your health care provider know about:  Any allergies you have.  All medicines you are taking, including vitamins, herbs, eye drops, creams, and over-the-counter medicines.  Any use of steroids (by mouth or as a cream).  Any problems you or family members have had with sedatives and anesthetic medicines.  Any blood disorders you have.  Any surgeries you have had.  Any medical conditions you have, such as sleep apnea.  Whether you are pregnant or may be pregnant.  Any use of cigarettes, alcohol, or street drugs. What are the risks? Generally, this is a safe procedure. However, problems may occur, including:  Getting too much medicine (oversedation).  Nausea.  Allergic reaction to medicines.  Trouble breathing. If this happens, a breathing tube may be used to help with breathing. It will be removed when you are awake and breathing on your own.  Heart  trouble.  Lung trouble.  Before the procedure Staying hydrated Follow instructions from your health care provider about hydration, which may include:  Up to 2 hours before the procedure - you may continue to drink clear liquids, such as water, clear fruit juice, black  coffee, and plain tea.  Eating and drinking restrictions Follow instructions from your health care provider about eating and drinking, which may include:  8 hours before the procedure - stop eating heavy meals or foods such as meat, fried foods, or fatty foods.  6 hours before the procedure - stop eating light meals or foods, such as toast or cereal.  6 hours before the procedure - stop drinking milk or drinks that contain milk.  2 hours before the procedure - stop drinking clear liquids.  Medicines Ask your health care provider about:  Changing or stopping your regular medicines. This is especially important if you are taking diabetes medicines or blood thinners.  Taking medicines such as aspirin and ibuprofen. These medicines can thin your blood. Do not take these medicines before your procedure if your health care provider instructs you not to.  Tests and exams  You will have a physical exam.  You may have blood tests done to show: ? How well your kidneys and liver are working. ? How well your blood can clot.  General instructions  Plan to have someone take you home from the hospital or clinic.  If you will be going home right after the procedure, plan to have someone with you for 24 hours.  What happens during the procedure?  Your blood pressure, heart rate, breathing, level of pain and overall condition will be monitored.  An IV tube will be inserted into one of your veins.  Your anesthesia specialist will give you medicines as needed to keep you comfortable during the procedure. This may mean changing the level of sedation.  The procedure will be performed. After the procedure  Your blood  pressure, heart rate, breathing rate, and blood oxygen level will be monitored until the medicines you were given have worn off.  Do not drive for 24 hours if you received a sedative.  You may: ? Feel sleepy, clumsy, or nauseous. ? Feel forgetful about what happened after the procedure. ? Have a sore throat if you had a breathing tube during the procedure. ? Vomit. This information is not intended to replace advice given to you by your health care provider. Make sure you discuss any questions you have with your health care provider. Document Released: 12/24/2004 Document Revised: 09/06/2015 Document Reviewed: 07/21/2015 Elsevier Interactive Patient Education  2018 [place], Care After These instructions provide you with information about caring for yourself after your procedure. Your health care provider may also give you more specific instructions. Your treatment has been planned according to current medical practices, but problems sometimes occur. Call your health care provider if you have any problems or questions after your procedure. What can I expect after the procedure? After your procedure, it is common to:  Feel sleepy for several hours.  Feel clumsy and have poor balance for several hours.  Feel forgetful about what happened after the procedure.  Have poor judgment for several hours.  Feel nauseous or vomit.  Have a sore throat if you had a breathing tube during the procedure.  Follow these instructions at home: For at least 24 hours after the procedure:   Do not: ? Participate in activities in which you could fall or become injured. ? Drive. ? Use heavy machinery. ? Drink alcohol. ? Take sleeping pills or medicines that cause drowsiness. ? Make important decisions or sign legal documents. ? Take care of children on your own.  Rest. Eating and drinking  Follow the diet  that is recommended by your health care provider.  If you  vomit, drink water, juice, or soup when you can drink without vomiting.  Make sure you have little or no nausea before eating solid foods. General instructions  Have a responsible adult stay with you until you are awake and alert.  Take over-the-counter and prescription medicines only as told by your health care provider.  If you smoke, do not smoke without supervision.  Keep all follow-up visits as told by your health care provider. This is important. Contact a health care provider if:  You keep feeling nauseous or you keep vomiting.  You feel light-headed.  You develop a rash.  You have a fever. Get help right away if:  You have trouble breathing. This information is not intended to replace advice given to you by your health care provider. Make sure you discuss any questions you have with your health care provider. Document Released: 07/21/2015 Document Revised: 11/20/2015 Document Reviewed: 07/21/2015 Elsevier Interactive Patient Education  [employer].

## 2018-01-28 ENCOUNTER — Encounter (HOSPITAL_COMMUNITY)
Admission: RE | Admit: 2018-01-28 | Discharge: 2018-01-28 | Disposition: A | Discharge status: Home / Self Care | Payer: Medicare Other | Source: Ambulatory Visit | Attending: Internal Medicine | Admitting: Internal Medicine

## 2018-01-28 ENCOUNTER — Telehealth: Payer: Self-pay | Admitting: Gastroenterology

## 2018-01-28 ENCOUNTER — Encounter (HOSPITAL_COMMUNITY): Payer: Self-pay

## 2018-01-28 ENCOUNTER — Other Ambulatory Visit: Payer: Self-pay

## 2018-01-28 DIAGNOSIS — I10 Essential (primary) hypertension: Secondary | ICD-10-CM | POA: Insufficient documentation

## 2018-01-28 DIAGNOSIS — Z01818 Encounter for other preprocedural examination: Secondary | ICD-10-CM | POA: Diagnosis not present

## 2018-01-28 DIAGNOSIS — Z23 Encounter for immunization: Secondary | ICD-10-CM | POA: Diagnosis not present

## 2018-01-28 LAB — CBC
HCT: 44.9 % (ref 39.0–52.0)
Hemoglobin: 13.7 g/dL (ref 13.0–17.0)
MCH: 28.5 pg (ref 26.0–34.0)
MCHC: 30.5 g/dL (ref 30.0–36.0)
MCV: 93.3 fL (ref 80.0–100.0)
Platelets: 165 10*3/uL (ref 150–400)
RBC: 4.81 MIL/uL (ref 4.22–5.81)
RDW: 14.4 % (ref 11.5–15.5)
WBC: 9.5 10*3/uL (ref 4.0–10.5)
nRBC: 0 % (ref 0.0–0.2)

## 2018-01-28 LAB — HEPATIC FUNCTION PANEL
ALK PHOS: 93 U/L (ref 38–126)
ALT: 23 U/L (ref 0–44)
AST: 16 U/L (ref 15–41)
Albumin: 4 g/dL (ref 3.5–5.0)
BILIRUBIN DIRECT: 0.1 mg/dL (ref 0.0–0.2)
BILIRUBIN INDIRECT: 0.5 mg/dL (ref 0.3–0.9)
BILIRUBIN TOTAL: 0.6 mg/dL (ref 0.3–1.2)
Total Protein: 7.7 g/dL (ref 6.5–8.1)

## 2018-01-28 LAB — BASIC METABOLIC PANEL
Anion gap: 8 (ref 5–15)
BUN: 17 mg/dL (ref 8–23)
CALCIUM: 9.5 mg/dL (ref 8.9–10.3)
CHLORIDE: 101 mmol/L (ref 98–111)
CO2: 33 mmol/L — AB (ref 22–32)
CREATININE: 0.53 mg/dL — AB (ref 0.61–1.24)
GFR calc Af Amer: >60 mL/min (ref >60)
GFR calc non Af Amer: >60 mL/min (ref >60)
Glucose, Bld: 93 mg/dL (ref 70–99)
Potassium: 3.1 mmol/L — ABNORMAL LOW (ref 3.5–5.1)
Sodium: 142 mmol/L (ref 135–145)

## 2018-01-28 MED ORDER — POTASSIUM CHLORIDE ER 10 MEQ PO TBCR: 40.0000 meq | EXTENDED_RELEASE_TABLET | Freq: Two times a day (BID) | ORAL | 0 refills | Status: AC

## 2018-01-28 NOTE — Progress Notes (Signed)
Potassium is 3.1. I sent in potassium chloride 40 meQ BID for 4 days. He lives in assisted living I believe? Can we make sure they start him on this to take?

## 2018-01-31 NOTE — Progress Notes (Signed)
Alicia: I have addressed this and spoke with the facility. They are aware.

## 2018-02-02 ENCOUNTER — Encounter (HOSPITAL_COMMUNITY): Payer: Self-pay | Admitting: Anesthesiology

## 2018-02-03 ENCOUNTER — Telehealth: Payer: Self-pay | Admitting: *Deleted

## 2018-02-03 MED ORDER — PROPOFOL 10 MG/ML IV BOLUS
INTRAVENOUS | Status: AC
  Filled 2018-02-03: qty 40

## 2018-02-03 MED ORDER — PEG 3350-KCL-NA BICARB-NACL 420 G PO SOLR: 4000.0000 mL | Freq: Once | ORAL | 0 refills | Status: AC

## 2018-02-03 NOTE — Telephone Encounter (Signed)
Rosendo Gros, I wanted you to see this as an FYI.   Somehow, we need to ensure that patient completes colonoscopy as planned in future. I don't feel he needs hospital admission as he prepped without difficulty last time.

## 2018-02-03 NOTE — Telephone Encounter (Signed)
Reviewed

## 2018-02-03 NOTE — Telephone Encounter (Signed)
Received VM from Gary Richard that stated she did receive faxed instructions for patient and is aware when pre-op phone call will be.

## 2018-02-03 NOTE — Telephone Encounter (Addendum)
Received a call from [place] in [facility]. She reports patient was a NS for procedure today. When they called the nursing facility they acted like they didn't know he had a procedure today and he did not prep for this. I advised Maudie Mercury that patient showed up for pre-op on 01/1818. See TE encounter dated 12/31/17, I spoke with Arbie Cookey from [facility] and scheduled this procedure with her, faxed the instructions and sent prep to [place] called [facility] [facility] and spoke with [place]. She could not explain what happened. She also stated some of the nurses stated they did have his prep for TCS and others said they did not. I r/s'd procedure for 03/03/18 at 12:45pm. I have faxed instructions to Arbie Cookey at [phone]. I have resent rx to [facility]. Pre-op phone call for 02/24/18.   FYI to AB

## 2018-02-03 NOTE — OR Nursing (Signed)
No show for procedure, called facility  [facility] Spoke with his nurse. Nurse stated that he never got his prescription filled and he did not do a prep.  Facility to call office  to report that procedure needs to be rescheduled.

## 2018-02-04 DIAGNOSIS — E10622 Type 1 diabetes mellitus with other skin ulcer: Secondary | ICD-10-CM | POA: Diagnosis not present

## 2018-02-04 DIAGNOSIS — I10 Essential (primary) hypertension: Secondary | ICD-10-CM | POA: Diagnosis not present

## 2018-02-04 DIAGNOSIS — E11621 Type 2 diabetes mellitus with foot ulcer: Secondary | ICD-10-CM | POA: Diagnosis not present

## 2018-02-04 DIAGNOSIS — L97512 Non-pressure chronic ulcer of other part of right foot with fat layer exposed: Secondary | ICD-10-CM | POA: Diagnosis not present

## 2018-02-04 DIAGNOSIS — E11622 Type 2 diabetes mellitus with other skin ulcer: Secondary | ICD-10-CM | POA: Diagnosis not present

## 2018-02-04 DIAGNOSIS — L97312 Non-pressure chronic ulcer of right ankle with fat layer exposed: Secondary | ICD-10-CM | POA: Diagnosis not present

## 2018-02-04 DIAGNOSIS — Q858 Other phakomatoses, not elsewhere classified: Secondary | ICD-10-CM | POA: Diagnosis not present

## 2018-02-04 DIAGNOSIS — E10621 Type 1 diabetes mellitus with foot ulcer: Secondary | ICD-10-CM | POA: Diagnosis not present

## 2018-02-06 DIAGNOSIS — F79 Unspecified intellectual disabilities: Secondary | ICD-10-CM | POA: Diagnosis not present

## 2018-02-06 DIAGNOSIS — I10 Essential (primary) hypertension: Secondary | ICD-10-CM | POA: Diagnosis not present

## 2018-02-06 DIAGNOSIS — R1312 Dysphagia, oropharyngeal phase: Secondary | ICD-10-CM | POA: Diagnosis not present

## 2018-02-06 DIAGNOSIS — G40909 Epilepsy, unspecified, not intractable, without status epilepticus: Secondary | ICD-10-CM | POA: Diagnosis not present

## 2018-02-10 DIAGNOSIS — E7800 Pure hypercholesterolemia, unspecified: Secondary | ICD-10-CM | POA: Diagnosis not present

## 2018-02-17 DIAGNOSIS — E1140 Type 2 diabetes mellitus with diabetic neuropathy, unspecified: Secondary | ICD-10-CM | POA: Diagnosis not present

## 2018-02-17 DIAGNOSIS — E1151 Type 2 diabetes mellitus with diabetic peripheral angiopathy without gangrene: Secondary | ICD-10-CM | POA: Diagnosis not present

## 2018-02-17 DIAGNOSIS — B351 Tinea unguium: Secondary | ICD-10-CM | POA: Diagnosis not present

## 2018-02-17 DIAGNOSIS — Z794 Long term (current) use of insulin: Secondary | ICD-10-CM | POA: Diagnosis not present

## 2018-02-18 ENCOUNTER — Encounter (HOSPITAL_BASED_OUTPATIENT_CLINIC_OR_DEPARTMENT_OTHER): Payer: Medicare Other | Attending: Internal Medicine

## 2018-02-18 DIAGNOSIS — E11621 Type 2 diabetes mellitus with foot ulcer: Secondary | ICD-10-CM | POA: Diagnosis not present

## 2018-02-18 DIAGNOSIS — L89512 Pressure ulcer of right ankle, stage 2: Secondary | ICD-10-CM | POA: Insufficient documentation

## 2018-02-18 DIAGNOSIS — I10 Essential (primary) hypertension: Secondary | ICD-10-CM | POA: Diagnosis not present

## 2018-02-18 DIAGNOSIS — L97511 Non-pressure chronic ulcer of other part of right foot limited to breakdown of skin: Secondary | ICD-10-CM | POA: Diagnosis not present

## 2018-02-18 DIAGNOSIS — E1151 Type 2 diabetes mellitus with diabetic peripheral angiopathy without gangrene: Secondary | ICD-10-CM | POA: Diagnosis not present

## 2018-02-18 DIAGNOSIS — L97312 Non-pressure chronic ulcer of right ankle with fat layer exposed: Secondary | ICD-10-CM | POA: Diagnosis not present

## 2018-02-18 DIAGNOSIS — Q859 Phakomatosis, unspecified: Secondary | ICD-10-CM | POA: Diagnosis not present

## 2018-02-18 DIAGNOSIS — E11622 Type 2 diabetes mellitus with other skin ulcer: Secondary | ICD-10-CM | POA: Diagnosis not present

## 2018-02-24 ENCOUNTER — Encounter (HOSPITAL_COMMUNITY): Payer: Self-pay

## 2018-02-24 ENCOUNTER — Encounter (HOSPITAL_COMMUNITY)
Admission: RE | Admit: 2018-02-24 | Discharge: 2018-02-24 | Disposition: A | Discharge status: Home / Self Care | Payer: Medicare Other | Source: Ambulatory Visit | Attending: Internal Medicine | Admitting: Internal Medicine

## 2018-03-01 DIAGNOSIS — B351 Tinea unguium: Secondary | ICD-10-CM | POA: Diagnosis not present

## 2018-03-01 DIAGNOSIS — E119 Type 2 diabetes mellitus without complications: Secondary | ICD-10-CM | POA: Diagnosis not present

## 2018-03-01 DIAGNOSIS — R269 Unspecified abnormalities of gait and mobility: Secondary | ICD-10-CM | POA: Diagnosis not present

## 2018-03-01 DIAGNOSIS — I739 Peripheral vascular disease, unspecified: Secondary | ICD-10-CM | POA: Diagnosis not present

## 2018-03-02 DIAGNOSIS — E119 Type 2 diabetes mellitus without complications: Secondary | ICD-10-CM | POA: Diagnosis not present

## 2018-03-03 ENCOUNTER — Encounter (HOSPITAL_COMMUNITY): Admission: RE | Payer: Self-pay | Source: Ambulatory Visit

## 2018-03-03 ENCOUNTER — Ambulatory Visit (HOSPITAL_COMMUNITY): Admission: RE | Admit: 2018-03-03 | Payer: Medicare Other | Source: Ambulatory Visit | Admitting: Internal Medicine

## 2018-03-03 ENCOUNTER — Telehealth: Payer: Self-pay | Admitting: Internal Medicine

## 2018-03-03 DIAGNOSIS — E119 Type 2 diabetes mellitus without complications: Secondary | ICD-10-CM | POA: Diagnosis not present

## 2018-03-03 SURGERY — COLONOSCOPY WITH PROPOFOL
Anesthesia: Monitor Anesthesia Care

## 2018-03-03 NOTE — Telephone Encounter (Signed)
Leah from [facility] called to say that patient was scheduled for his colonoscopy today, but he didn't go. Patient doesn't understand why he needs colonoscopy and why he has to drink the prep. Denny Peon is asking is there something RMR is looking for and is there another test to do. I told her patient was due his 2 year colonoscopy and he needs to drink the prep to get cleaned out. She is saying patient doesn't understand. Please call her at [place]

## 2018-03-03 NOTE — Telephone Encounter (Signed)
Spoke with Denny Peon and made her aware patient was scheduled for TCS d/t hx of polyps. Patient was agreeable at last OV with AB to have TCS done. She states patient will agree to anything but he still won't understand what you are saying to him no matter how many times you explain it to him. She is aware will forward message to AB and she is out until next week. Leah voiced understanding.

## 2018-03-04 DIAGNOSIS — L97511 Non-pressure chronic ulcer of other part of right foot limited to breakdown of skin: Secondary | ICD-10-CM | POA: Diagnosis not present

## 2018-03-04 DIAGNOSIS — E119 Type 2 diabetes mellitus without complications: Secondary | ICD-10-CM | POA: Diagnosis not present

## 2018-03-04 DIAGNOSIS — E11622 Type 2 diabetes mellitus with other skin ulcer: Secondary | ICD-10-CM | POA: Diagnosis not present

## 2018-03-04 DIAGNOSIS — L97312 Non-pressure chronic ulcer of right ankle with fat layer exposed: Secondary | ICD-10-CM | POA: Diagnosis not present

## 2018-03-04 DIAGNOSIS — Q859 Phakomatosis, unspecified: Secondary | ICD-10-CM | POA: Diagnosis not present

## 2018-03-04 DIAGNOSIS — E1151 Type 2 diabetes mellitus with diabetic peripheral angiopathy without gangrene: Secondary | ICD-10-CM | POA: Diagnosis not present

## 2018-03-04 DIAGNOSIS — E11621 Type 2 diabetes mellitus with foot ulcer: Secondary | ICD-10-CM | POA: Diagnosis not present

## 2018-03-04 DIAGNOSIS — L89512 Pressure ulcer of right ankle, stage 2: Secondary | ICD-10-CM | POA: Diagnosis not present

## 2018-03-04 DIAGNOSIS — I10 Essential (primary) hypertension: Secondary | ICD-10-CM | POA: Diagnosis not present

## 2018-03-05 DIAGNOSIS — E119 Type 2 diabetes mellitus without complications: Secondary | ICD-10-CM | POA: Diagnosis not present

## 2018-03-06 DIAGNOSIS — E119 Type 2 diabetes mellitus without complications: Secondary | ICD-10-CM | POA: Diagnosis not present

## 2018-03-07 DIAGNOSIS — E119 Type 2 diabetes mellitus without complications: Secondary | ICD-10-CM | POA: Diagnosis not present

## 2018-03-07 NOTE — Telephone Encounter (Signed)
We need to find out who is healthcare power of attorney and giving consent. It may not be feasible to pursue if he does not going to drink the prep.

## 2018-03-07 NOTE — Telephone Encounter (Signed)
Called facility and it is [facility]. It is patient contacts as well.

## 2018-03-08 DIAGNOSIS — E119 Type 2 diabetes mellitus without complications: Secondary | ICD-10-CM | POA: Diagnosis not present

## 2018-03-09 DIAGNOSIS — E119 Type 2 diabetes mellitus without complications: Secondary | ICD-10-CM | POA: Diagnosis not present

## 2018-03-12 DIAGNOSIS — E119 Type 2 diabetes mellitus without complications: Secondary | ICD-10-CM | POA: Diagnosis not present

## 2018-03-13 DIAGNOSIS — F79 Unspecified intellectual disabilities: Secondary | ICD-10-CM | POA: Diagnosis not present

## 2018-03-13 DIAGNOSIS — G40909 Epilepsy, unspecified, not intractable, without status epilepticus: Secondary | ICD-10-CM | POA: Diagnosis not present

## 2018-03-13 DIAGNOSIS — R600 Localized edema: Secondary | ICD-10-CM | POA: Diagnosis not present

## 2018-03-13 DIAGNOSIS — R1312 Dysphagia, oropharyngeal phase: Secondary | ICD-10-CM | POA: Diagnosis not present

## 2018-03-14 DIAGNOSIS — E119 Type 2 diabetes mellitus without complications: Secondary | ICD-10-CM | POA: Diagnosis not present

## 2018-03-16 DIAGNOSIS — E119 Type 2 diabetes mellitus without complications: Secondary | ICD-10-CM | POA: Diagnosis not present

## 2018-03-17 DIAGNOSIS — E119 Type 2 diabetes mellitus without complications: Secondary | ICD-10-CM | POA: Diagnosis not present

## 2018-03-18 ENCOUNTER — Encounter (HOSPITAL_BASED_OUTPATIENT_CLINIC_OR_DEPARTMENT_OTHER): Payer: Medicare Other | Attending: Internal Medicine

## 2018-03-18 DIAGNOSIS — I10 Essential (primary) hypertension: Secondary | ICD-10-CM | POA: Insufficient documentation

## 2018-03-18 DIAGNOSIS — L89899 Pressure ulcer of other site, unspecified stage: Secondary | ICD-10-CM | POA: Diagnosis not present

## 2018-03-18 DIAGNOSIS — E11622 Type 2 diabetes mellitus with other skin ulcer: Secondary | ICD-10-CM | POA: Diagnosis not present

## 2018-03-18 DIAGNOSIS — E119 Type 2 diabetes mellitus without complications: Secondary | ICD-10-CM | POA: Diagnosis not present

## 2018-03-18 DIAGNOSIS — L97312 Non-pressure chronic ulcer of right ankle with fat layer exposed: Secondary | ICD-10-CM | POA: Insufficient documentation

## 2018-03-19 DIAGNOSIS — E119 Type 2 diabetes mellitus without complications: Secondary | ICD-10-CM | POA: Diagnosis not present

## 2018-03-22 DIAGNOSIS — E119 Type 2 diabetes mellitus without complications: Secondary | ICD-10-CM | POA: Diagnosis not present

## 2018-03-23 DIAGNOSIS — E119 Type 2 diabetes mellitus without complications: Secondary | ICD-10-CM | POA: Diagnosis not present

## 2018-03-24 DIAGNOSIS — E119 Type 2 diabetes mellitus without complications: Secondary | ICD-10-CM | POA: Diagnosis not present

## 2018-03-25 DIAGNOSIS — E119 Type 2 diabetes mellitus without complications: Secondary | ICD-10-CM | POA: Diagnosis not present

## 2018-03-26 DIAGNOSIS — E119 Type 2 diabetes mellitus without complications: Secondary | ICD-10-CM | POA: Diagnosis not present

## 2018-03-29 DIAGNOSIS — E119 Type 2 diabetes mellitus without complications: Secondary | ICD-10-CM | POA: Diagnosis not present

## 2018-03-30 DIAGNOSIS — E119 Type 2 diabetes mellitus without complications: Secondary | ICD-10-CM | POA: Diagnosis not present

## 2018-03-31 DIAGNOSIS — E119 Type 2 diabetes mellitus without complications: Secondary | ICD-10-CM | POA: Diagnosis not present

## 2018-04-01 DIAGNOSIS — E119 Type 2 diabetes mellitus without complications: Secondary | ICD-10-CM | POA: Diagnosis not present

## 2018-04-02 DIAGNOSIS — E119 Type 2 diabetes mellitus without complications: Secondary | ICD-10-CM | POA: Diagnosis not present

## 2018-04-03 DIAGNOSIS — E119 Type 2 diabetes mellitus without complications: Secondary | ICD-10-CM | POA: Diagnosis not present

## 2018-04-04 DIAGNOSIS — E119 Type 2 diabetes mellitus without complications: Secondary | ICD-10-CM | POA: Diagnosis not present

## 2018-04-07 DIAGNOSIS — E119 Type 2 diabetes mellitus without complications: Secondary | ICD-10-CM | POA: Diagnosis not present

## 2018-04-08 DIAGNOSIS — E119 Type 2 diabetes mellitus without complications: Secondary | ICD-10-CM | POA: Diagnosis not present

## 2018-04-09 DIAGNOSIS — E119 Type 2 diabetes mellitus without complications: Secondary | ICD-10-CM | POA: Diagnosis not present

## 2018-04-11 DIAGNOSIS — E119 Type 2 diabetes mellitus without complications: Secondary | ICD-10-CM | POA: Diagnosis not present

## 2018-04-11 DIAGNOSIS — E11622 Type 2 diabetes mellitus with other skin ulcer: Secondary | ICD-10-CM | POA: Diagnosis not present

## 2018-04-11 DIAGNOSIS — L97312 Non-pressure chronic ulcer of right ankle with fat layer exposed: Secondary | ICD-10-CM | POA: Diagnosis not present

## 2018-04-11 DIAGNOSIS — I10 Essential (primary) hypertension: Secondary | ICD-10-CM | POA: Diagnosis not present

## 2018-04-12 DIAGNOSIS — E119 Type 2 diabetes mellitus without complications: Secondary | ICD-10-CM | POA: Diagnosis not present

## 2018-04-13 DIAGNOSIS — I739 Peripheral vascular disease, unspecified: Secondary | ICD-10-CM | POA: Diagnosis not present

## 2018-04-13 DIAGNOSIS — I10 Essential (primary) hypertension: Secondary | ICD-10-CM | POA: Diagnosis not present

## 2018-04-13 DIAGNOSIS — R600 Localized edema: Secondary | ICD-10-CM | POA: Diagnosis not present

## 2018-04-13 DIAGNOSIS — Q858 Other phakomatoses, not elsewhere classified: Secondary | ICD-10-CM | POA: Diagnosis not present

## 2018-04-14 DIAGNOSIS — E119 Type 2 diabetes mellitus without complications: Secondary | ICD-10-CM | POA: Diagnosis not present

## 2018-04-15 DIAGNOSIS — E119 Type 2 diabetes mellitus without complications: Secondary | ICD-10-CM | POA: Diagnosis not present

## 2018-04-16 DIAGNOSIS — E119 Type 2 diabetes mellitus without complications: Secondary | ICD-10-CM | POA: Diagnosis not present

## 2018-04-18 DIAGNOSIS — E119 Type 2 diabetes mellitus without complications: Secondary | ICD-10-CM | POA: Diagnosis not present

## 2018-04-18 DIAGNOSIS — S81801A Unspecified open wound, right lower leg, initial encounter: Secondary | ICD-10-CM | POA: Diagnosis not present

## 2018-04-18 DIAGNOSIS — S81802A Unspecified open wound, left lower leg, initial encounter: Secondary | ICD-10-CM | POA: Diagnosis not present

## 2018-04-19 DIAGNOSIS — D649 Anemia, unspecified: Secondary | ICD-10-CM | POA: Diagnosis not present

## 2018-04-19 DIAGNOSIS — Z79899 Other long term (current) drug therapy: Secondary | ICD-10-CM | POA: Diagnosis not present

## 2018-04-19 DIAGNOSIS — E119 Type 2 diabetes mellitus without complications: Secondary | ICD-10-CM | POA: Diagnosis not present

## 2018-04-20 DIAGNOSIS — E119 Type 2 diabetes mellitus without complications: Secondary | ICD-10-CM | POA: Diagnosis not present

## 2018-04-21 DIAGNOSIS — E119 Type 2 diabetes mellitus without complications: Secondary | ICD-10-CM | POA: Diagnosis not present

## 2018-04-24 DIAGNOSIS — E119 Type 2 diabetes mellitus without complications: Secondary | ICD-10-CM | POA: Diagnosis not present

## 2018-04-24 DIAGNOSIS — R600 Localized edema: Secondary | ICD-10-CM | POA: Diagnosis not present

## 2018-04-24 DIAGNOSIS — K219 Gastro-esophageal reflux disease without esophagitis: Secondary | ICD-10-CM | POA: Diagnosis not present

## 2018-04-24 DIAGNOSIS — L03115 Cellulitis of right lower limb: Secondary | ICD-10-CM | POA: Diagnosis not present

## 2018-04-25 ENCOUNTER — Encounter (HOSPITAL_BASED_OUTPATIENT_CLINIC_OR_DEPARTMENT_OTHER): Payer: Medicare Other | Attending: Internal Medicine

## 2018-04-25 DIAGNOSIS — Z09 Encounter for follow-up examination after completed treatment for conditions other than malignant neoplasm: Secondary | ICD-10-CM | POA: Diagnosis not present

## 2018-04-25 DIAGNOSIS — Z872 Personal history of diseases of the skin and subcutaneous tissue: Secondary | ICD-10-CM | POA: Insufficient documentation

## 2018-04-25 DIAGNOSIS — E1051 Type 1 diabetes mellitus with diabetic peripheral angiopathy without gangrene: Secondary | ICD-10-CM | POA: Diagnosis not present

## 2018-04-25 DIAGNOSIS — Q858 Other phakomatoses, not elsewhere classified: Secondary | ICD-10-CM | POA: Diagnosis not present

## 2018-04-25 DIAGNOSIS — I10 Essential (primary) hypertension: Secondary | ICD-10-CM | POA: Diagnosis not present

## 2018-04-25 DIAGNOSIS — S91001A Unspecified open wound, right ankle, initial encounter: Secondary | ICD-10-CM | POA: Diagnosis not present

## 2018-05-18 DIAGNOSIS — M24521 Contracture, right elbow: Secondary | ICD-10-CM | POA: Diagnosis not present

## 2018-05-18 DIAGNOSIS — R293 Abnormal posture: Secondary | ICD-10-CM | POA: Diagnosis not present

## 2018-05-18 DIAGNOSIS — M24531 Contracture, right wrist: Secondary | ICD-10-CM | POA: Diagnosis not present

## 2018-05-19 DIAGNOSIS — M24531 Contracture, right wrist: Secondary | ICD-10-CM | POA: Diagnosis not present

## 2018-05-19 DIAGNOSIS — R293 Abnormal posture: Secondary | ICD-10-CM | POA: Diagnosis not present

## 2018-05-19 DIAGNOSIS — M24521 Contracture, right elbow: Secondary | ICD-10-CM | POA: Diagnosis not present

## 2018-05-20 DIAGNOSIS — M24521 Contracture, right elbow: Secondary | ICD-10-CM | POA: Diagnosis not present

## 2018-05-20 DIAGNOSIS — M24531 Contracture, right wrist: Secondary | ICD-10-CM | POA: Diagnosis not present

## 2018-05-20 DIAGNOSIS — R293 Abnormal posture: Secondary | ICD-10-CM | POA: Diagnosis not present

## 2018-05-21 DIAGNOSIS — R293 Abnormal posture: Secondary | ICD-10-CM | POA: Diagnosis not present

## 2018-05-21 DIAGNOSIS — M24531 Contracture, right wrist: Secondary | ICD-10-CM | POA: Diagnosis not present

## 2018-05-21 DIAGNOSIS — M24521 Contracture, right elbow: Secondary | ICD-10-CM | POA: Diagnosis not present

## 2018-05-23 DIAGNOSIS — M24521 Contracture, right elbow: Secondary | ICD-10-CM | POA: Diagnosis not present

## 2018-05-23 DIAGNOSIS — R293 Abnormal posture: Secondary | ICD-10-CM | POA: Diagnosis not present

## 2018-05-23 DIAGNOSIS — M24531 Contracture, right wrist: Secondary | ICD-10-CM | POA: Diagnosis not present

## 2018-05-24 DIAGNOSIS — M24521 Contracture, right elbow: Secondary | ICD-10-CM | POA: Diagnosis not present

## 2018-05-24 DIAGNOSIS — R293 Abnormal posture: Secondary | ICD-10-CM | POA: Diagnosis not present

## 2018-05-24 DIAGNOSIS — M24531 Contracture, right wrist: Secondary | ICD-10-CM | POA: Diagnosis not present

## 2018-05-25 DIAGNOSIS — R293 Abnormal posture: Secondary | ICD-10-CM | POA: Diagnosis not present

## 2018-05-25 DIAGNOSIS — M24521 Contracture, right elbow: Secondary | ICD-10-CM | POA: Diagnosis not present

## 2018-05-25 DIAGNOSIS — M24531 Contracture, right wrist: Secondary | ICD-10-CM | POA: Diagnosis not present

## 2018-05-26 ENCOUNTER — Ambulatory Visit (INDEPENDENT_AMBULATORY_CARE_PROVIDER_SITE_OTHER): Payer: Medicare Other | Admitting: Neurology

## 2018-05-26 ENCOUNTER — Other Ambulatory Visit: Payer: Self-pay

## 2018-05-26 ENCOUNTER — Encounter: Payer: Self-pay | Admitting: Neurology

## 2018-05-26 VITALS — BP 138/84 | HR 102

## 2018-05-26 DIAGNOSIS — R293 Abnormal posture: Secondary | ICD-10-CM | POA: Diagnosis not present

## 2018-05-26 DIAGNOSIS — Z87898 Personal history of other specified conditions: Secondary | ICD-10-CM

## 2018-05-26 DIAGNOSIS — M24531 Contracture, right wrist: Secondary | ICD-10-CM | POA: Diagnosis not present

## 2018-05-26 DIAGNOSIS — Q858 Other phakomatoses, not elsewhere classified: Secondary | ICD-10-CM | POA: Diagnosis not present

## 2018-05-26 DIAGNOSIS — M24521 Contracture, right elbow: Secondary | ICD-10-CM | POA: Diagnosis not present

## 2018-05-26 DIAGNOSIS — Q8589 Other phakomatoses, not elsewhere classified: Secondary | ICD-10-CM

## 2018-05-26 NOTE — Patient Instructions (Signed)
1. Continue all your medications 2. Continue 24/7 supervision 3. Follow-up as needed, call for any changes.

## 2018-05-26 NOTE — Progress Notes (Signed)
NEUROLOGY FOLLOW UP OFFICE NOTE  Gary Richard [id number] Jun 19, 1955  HISTORY OF PRESENT ILLNESS: I had the pleasure of seeing Gary Richard in follow-up in the neurology clinic on 05/26/2018.  The patient was last seen a year ago and is again accompanied by [facility] staff who provides history today as Gary Richard is cognitively impaired. Records and images were personally reviewed where available. Staff denies any issues, no report of seizures. Appetite and sleep are good, no behavioral concerns. He is sitting on a wheelchair today, smiling and answers in one-word responses when asked questions.   History on Initial Assessment 05/26/2017: This is a very pleasant 63 year old man with a history of diabetes, hypertension, hyperlipidemia, Sturge-Weber syndrome, intellectual disability, per referral here for "history of seizure." Staff with him today does not know why he is here and is not aware of any seizures. I spoke to referring nurse practitioner Wynell Balloon on the phone, she reports that due to his history of seizures, he needs to see a neurologist as part of Medicaid requirements. He has been at [facility] since February and she has not witnessed any seizures or seizure-like activity. He is not on any seizure medication. He has intellectual disability and cannot answer questions, he can follow simple commands and say a few words, mostly laughing heartily when asked questions. There is no prior history attached to his records, none on Epic to review. There is a head CT without contrast done 04/09/17 available for review, I personally reviewed images, there is chronic left hemispheric atrophy with calcifications, mild cerebellar atrophy worse on right, no acute changes. There is asymmetric left calvarial thickening commensurate to the left-sided atrophy.   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 No other history available  PAST MEDICAL HISTORY: Past Medical History:  Diagnosis Date  . DM  (diabetes mellitus) ([place])   . Gout   . HTN (hypertension)   . Hypertriglyceridemia   . Mental retardation   . Sturge-Weber syndrome [facility])     MEDICATIONS: Current Outpatient Medications on File Prior to Visit  Medication Sig Dispense Refill  . AMINO ACIDS-PROTEIN HYDROLYS PO Take 30 mLs by mouth daily.    Marland Kitchen aspirin 81 MG tablet Take 81 mg by mouth daily.    Marland Kitchen atorvastatin (LIPITOR) 10 MG tablet     . bisacodyl (DULCOLAX) 5 MG EC tablet Take 10 mg by mouth once. Give once on 02/02/18 for colonoscopy prep.    . CHLORHEXIDINE GLUCONATE, BULK, SOLN Take 10 mLs by mouth daily.     . Cholecalciferol (VITAMIN D3) 2000 units TABS Take 2,000 Units by mouth every morning.    . clotrimazole-betamethasone (LOTRISONE) cream Apply 1 application topically 2 (two) times daily. Apply to lower back two times a day for fungal rash    . furosemide (LASIX) 20 MG tablet Take 20 mg by mouth daily.    . hydrochlorothiazide (MICROZIDE) 12.5 MG capsule Take 12.5 mg by mouth daily.    Marland Kitchen levothyroxine (SYNTHROID, LEVOTHROID) 100 MCG tablet Take 100 mcg by mouth at bedtime.    . metFORMIN (GLUCOPHAGE) 500 MG tablet Take 500 mg by mouth daily with breakfast.    . Multiple Vitamins-Minerals (CERTAGEN) tablet Take 1 tablet by mouth daily.    . Omega-3 Fatty Acids (FISH OIL) 1000 MG CAPS Take 1,000 mg by mouth daily.    Marland Kitchen omeprazole (PRILOSEC) 40 MG capsule Take 40 mg by mouth daily before breakfast.     . potassium chloride (K-DUR) 10 MEQ  tablet Take 4 tablets (40 mEq total) by mouth 2 (two) times daily for 4 days. 32 tablet 0  . pravastatin (PRAVACHOL) 10 MG tablet Take 10 mg by mouth at bedtime.     . sodium phosphate (FLEET) 7-19 GM/118ML ENEM Place 1 enema rectally once. Give once on 02/03/18 for colonscopy prep.    . vitamin C (ASCORBIC ACID) 500 MG tablet Take 500 mg by mouth 2 (two) times daily.    . white petrolatum (VASELINE) GEL Apply 1 application topically every 8 (eight) hours as needed for lip care.      No current facility-administered medications on file prior to visit.     ALLERGIES: No Known Allergies  FAMILY HISTORY: Family History  Problem Relation Age of Onset  . Other Unknown        unknown    SOCIAL HISTORY: Social History   Socioeconomic History  . Marital status: Unknown    Spouse name: Not on file  . Number of children: Not on file  . Years of education: Not on file  . Highest education level: Not on file  Occupational History  . Not on file  Social Needs  . Financial resource strain: Not on file  . Food insecurity:    Worry: Not on file    Inability: Not on file  . Transportation needs:    Medical: Not on file    Non-medical: Not on file  Tobacco Use  . Smoking status: Never Smoker  . Smokeless tobacco: Never Used  Substance and Sexual Activity  . Alcohol use: No    Alcohol/week: 0.0 standard drinks  . Drug use: No  . Sexual activity: Not on file  Lifestyle  . Physical activity:    Days per week: Not on file    Minutes per session: Not on file  . Stress: Not on file  Relationships  . Social connections:    Talks on phone: Not on file    Gets together: Not on file    Attends religious service: Not on file    Active member of club or organization: Not on file    Attends meetings of clubs or organizations: Not on file    Relationship status: Not on file  . Intimate partner violence:    Fear of current or ex partner: Not on file    Emotionally abused: Not on file    Physically abused: Not on file    Forced sexual activity: Not on file  Other Topics Concern  . Not on file  Social History Narrative   Lives in same group home over 30 years.     REVIEW OF SYSTEMS unable to obtain due to mental status  PHYSICAL EXAM: Vitals:   05/26/18 1018  BP: 138/84  Pulse: (!) 102  SpO2: 93%   General: No acute distress, sitting on wheelchair Head:  Portwine stain on left side of face (some on right cheek), upper and lower lip hyperplasia Eyes:  keeps left eye closed Neck: supple, no paraspinal tenderness, head bent to the left side Heart: regular rate and rhythm Lungs: Clear to auscultation bilaterally. Vascular: No carotid bruits. Skin/Extremities: No rash, bilateral leg edema Neurological Exam: Mental status: alert and oriented to person. He is dysarthric, able to say a few words, laughing but not answering questions. Able to show me 1 and 2 fingers on command Cranial nerves: CN I: not tested CN II: right pupil round reactive to light, keeps left eye closed, he mirrors how many fingers  I hold up.  CN III, IV, VI:  full range of motion on right, no nystagmus CN V: unable to test CN VII: he has bilateral lip enlargement and coarse facies CN VIII: hearing intact to voice Bulk & Tone: atrophy and spastic contracture on right UE (flexed at elbow and wrist), cogwheeling on left UE (similar to prior) Motor: 5/5 on left UE, at least 3+/5 on both LE, 0/5 on right UE Sensation: unable to test due to cognitive deficits Cerebellar: no incoordination on finger to nose on left UE Gait: not tested, he needs 2-person assist with walker at baseline Tremor: left hand resting tremor, no clear postural tremor, mild endpoint tremor on left (similar to prior)  IMPRESSION: This is a very pleasant 63 yo man with a history of hypertension, hyperlipidemia, diabetes, Sturge-Weber syndrome. He has a remote history of seizures and has not had any seizures in many years, not taking any medications. Head CT shows atrophy and calcifications on the left hemisphere consistent with Sturge-Weber syndrome. Continue to monitor clinically, continue supportive and 24/7 care. Follow-up prn, they know to call for any changes.   Thank you for allowing me to participate in his care.  Please do not hesitate to call for any questions or concerns.  The duration of this appointment visit was 15 minutes of face-to-face time with the patient.  Greater than 50% of this time  was spent in counseling, explanation of diagnosis, planning of further management, and coordination of care.   Ellouise Newer, M.D.   CC: Dr. Mal Amabile

## 2018-05-27 DIAGNOSIS — M24531 Contracture, right wrist: Secondary | ICD-10-CM | POA: Diagnosis not present

## 2018-05-27 DIAGNOSIS — M24521 Contracture, right elbow: Secondary | ICD-10-CM | POA: Diagnosis not present

## 2018-05-27 DIAGNOSIS — R293 Abnormal posture: Secondary | ICD-10-CM | POA: Diagnosis not present

## 2018-05-31 ENCOUNTER — Encounter: Payer: Self-pay | Admitting: Internal Medicine

## 2018-05-31 DIAGNOSIS — R293 Abnormal posture: Secondary | ICD-10-CM | POA: Diagnosis not present

## 2018-05-31 DIAGNOSIS — M24531 Contracture, right wrist: Secondary | ICD-10-CM | POA: Diagnosis not present

## 2018-05-31 DIAGNOSIS — M24521 Contracture, right elbow: Secondary | ICD-10-CM | POA: Diagnosis not present

## 2018-05-31 NOTE — Telephone Encounter (Signed)
Routing to Stacey  

## 2018-05-31 NOTE — Telephone Encounter (Signed)
Patient scheduled.

## 2018-05-31 NOTE — Telephone Encounter (Signed)
Let's arrange an office visit to discuss with patient. If possible, can Marlis Edelson, the healthcare power of attorney, be present?

## 2018-06-01 DIAGNOSIS — M24531 Contracture, right wrist: Secondary | ICD-10-CM | POA: Diagnosis not present

## 2018-06-01 DIAGNOSIS — R293 Abnormal posture: Secondary | ICD-10-CM | POA: Diagnosis not present

## 2018-06-01 DIAGNOSIS — M24521 Contracture, right elbow: Secondary | ICD-10-CM | POA: Diagnosis not present

## 2018-06-02 DIAGNOSIS — M24531 Contracture, right wrist: Secondary | ICD-10-CM | POA: Diagnosis not present

## 2018-06-02 DIAGNOSIS — M24521 Contracture, right elbow: Secondary | ICD-10-CM | POA: Diagnosis not present

## 2018-06-02 DIAGNOSIS — R293 Abnormal posture: Secondary | ICD-10-CM | POA: Diagnosis not present

## 2018-06-03 DIAGNOSIS — M24521 Contracture, right elbow: Secondary | ICD-10-CM | POA: Diagnosis not present

## 2018-06-03 DIAGNOSIS — R293 Abnormal posture: Secondary | ICD-10-CM | POA: Diagnosis not present

## 2018-06-03 DIAGNOSIS — M24531 Contracture, right wrist: Secondary | ICD-10-CM | POA: Diagnosis not present

## 2018-06-04 DIAGNOSIS — M24521 Contracture, right elbow: Secondary | ICD-10-CM | POA: Diagnosis not present

## 2018-06-04 DIAGNOSIS — R293 Abnormal posture: Secondary | ICD-10-CM | POA: Diagnosis not present

## 2018-06-04 DIAGNOSIS — M24531 Contracture, right wrist: Secondary | ICD-10-CM | POA: Diagnosis not present

## 2018-06-05 DIAGNOSIS — R293 Abnormal posture: Secondary | ICD-10-CM | POA: Diagnosis not present

## 2018-06-05 DIAGNOSIS — R269 Unspecified abnormalities of gait and mobility: Secondary | ICD-10-CM | POA: Diagnosis not present

## 2018-06-05 DIAGNOSIS — M24531 Contracture, right wrist: Secondary | ICD-10-CM | POA: Diagnosis not present

## 2018-06-05 DIAGNOSIS — E119 Type 2 diabetes mellitus without complications: Secondary | ICD-10-CM | POA: Diagnosis not present

## 2018-06-05 DIAGNOSIS — L03115 Cellulitis of right lower limb: Secondary | ICD-10-CM | POA: Diagnosis not present

## 2018-06-05 DIAGNOSIS — M24521 Contracture, right elbow: Secondary | ICD-10-CM | POA: Diagnosis not present

## 2018-06-05 DIAGNOSIS — M109 Gout, unspecified: Secondary | ICD-10-CM | POA: Diagnosis not present

## 2018-06-06 DIAGNOSIS — M24521 Contracture, right elbow: Secondary | ICD-10-CM | POA: Diagnosis not present

## 2018-06-06 DIAGNOSIS — R293 Abnormal posture: Secondary | ICD-10-CM | POA: Diagnosis not present

## 2018-06-06 DIAGNOSIS — M24531 Contracture, right wrist: Secondary | ICD-10-CM | POA: Diagnosis not present

## 2018-06-07 DIAGNOSIS — M24531 Contracture, right wrist: Secondary | ICD-10-CM | POA: Diagnosis not present

## 2018-06-07 DIAGNOSIS — M24521 Contracture, right elbow: Secondary | ICD-10-CM | POA: Diagnosis not present

## 2018-06-07 DIAGNOSIS — R293 Abnormal posture: Secondary | ICD-10-CM | POA: Diagnosis not present

## 2018-06-09 DIAGNOSIS — M24531 Contracture, right wrist: Secondary | ICD-10-CM | POA: Diagnosis not present

## 2018-06-09 DIAGNOSIS — R293 Abnormal posture: Secondary | ICD-10-CM | POA: Diagnosis not present

## 2018-06-09 DIAGNOSIS — M24521 Contracture, right elbow: Secondary | ICD-10-CM | POA: Diagnosis not present

## 2018-06-10 DIAGNOSIS — M24521 Contracture, right elbow: Secondary | ICD-10-CM | POA: Diagnosis not present

## 2018-06-10 DIAGNOSIS — R293 Abnormal posture: Secondary | ICD-10-CM | POA: Diagnosis not present

## 2018-06-10 DIAGNOSIS — M24531 Contracture, right wrist: Secondary | ICD-10-CM | POA: Diagnosis not present

## 2018-06-13 DIAGNOSIS — I739 Peripheral vascular disease, unspecified: Secondary | ICD-10-CM | POA: Diagnosis not present

## 2018-06-13 DIAGNOSIS — Q858 Other phakomatoses, not elsewhere classified: Secondary | ICD-10-CM | POA: Diagnosis not present

## 2018-06-13 DIAGNOSIS — R600 Localized edema: Secondary | ICD-10-CM | POA: Diagnosis not present

## 2018-06-13 DIAGNOSIS — I10 Essential (primary) hypertension: Secondary | ICD-10-CM | POA: Diagnosis not present

## 2018-06-13 DIAGNOSIS — M24521 Contracture, right elbow: Secondary | ICD-10-CM | POA: Diagnosis not present

## 2018-06-13 DIAGNOSIS — R293 Abnormal posture: Secondary | ICD-10-CM | POA: Diagnosis not present

## 2018-06-13 DIAGNOSIS — M24531 Contracture, right wrist: Secondary | ICD-10-CM | POA: Diagnosis not present

## 2018-06-14 DIAGNOSIS — M24521 Contracture, right elbow: Secondary | ICD-10-CM | POA: Diagnosis not present

## 2018-06-14 DIAGNOSIS — M24531 Contracture, right wrist: Secondary | ICD-10-CM | POA: Diagnosis not present

## 2018-06-14 DIAGNOSIS — R293 Abnormal posture: Secondary | ICD-10-CM | POA: Diagnosis not present

## 2018-06-15 DIAGNOSIS — M24531 Contracture, right wrist: Secondary | ICD-10-CM | POA: Diagnosis not present

## 2018-06-15 DIAGNOSIS — M24521 Contracture, right elbow: Secondary | ICD-10-CM | POA: Diagnosis not present

## 2018-06-15 DIAGNOSIS — R293 Abnormal posture: Secondary | ICD-10-CM | POA: Diagnosis not present

## 2018-06-16 DIAGNOSIS — M24531 Contracture, right wrist: Secondary | ICD-10-CM | POA: Diagnosis not present

## 2018-06-16 DIAGNOSIS — M24521 Contracture, right elbow: Secondary | ICD-10-CM | POA: Diagnosis not present

## 2018-06-16 DIAGNOSIS — R293 Abnormal posture: Secondary | ICD-10-CM | POA: Diagnosis not present

## 2018-06-17 DIAGNOSIS — M24531 Contracture, right wrist: Secondary | ICD-10-CM | POA: Diagnosis not present

## 2018-06-17 DIAGNOSIS — M24521 Contracture, right elbow: Secondary | ICD-10-CM | POA: Diagnosis not present

## 2018-06-17 DIAGNOSIS — R293 Abnormal posture: Secondary | ICD-10-CM | POA: Diagnosis not present

## 2018-06-19 DIAGNOSIS — R293 Abnormal posture: Secondary | ICD-10-CM | POA: Diagnosis not present

## 2018-06-19 DIAGNOSIS — M24521 Contracture, right elbow: Secondary | ICD-10-CM | POA: Diagnosis not present

## 2018-06-19 DIAGNOSIS — M24531 Contracture, right wrist: Secondary | ICD-10-CM | POA: Diagnosis not present

## 2018-06-20 DIAGNOSIS — M24531 Contracture, right wrist: Secondary | ICD-10-CM | POA: Diagnosis not present

## 2018-06-20 DIAGNOSIS — R293 Abnormal posture: Secondary | ICD-10-CM | POA: Diagnosis not present

## 2018-06-20 DIAGNOSIS — M24521 Contracture, right elbow: Secondary | ICD-10-CM | POA: Diagnosis not present

## 2018-06-21 NOTE — Telephone Encounter (Signed)
Opened in error

## 2018-07-21 DIAGNOSIS — B351 Tinea unguium: Secondary | ICD-10-CM | POA: Diagnosis not present

## 2018-07-21 DIAGNOSIS — I739 Peripheral vascular disease, unspecified: Secondary | ICD-10-CM | POA: Diagnosis not present

## 2018-07-27 DIAGNOSIS — I10 Essential (primary) hypertension: Secondary | ICD-10-CM | POA: Diagnosis not present

## 2018-07-27 DIAGNOSIS — Q858 Other phakomatoses, not elsewhere classified: Secondary | ICD-10-CM | POA: Diagnosis not present

## 2018-07-27 DIAGNOSIS — I739 Peripheral vascular disease, unspecified: Secondary | ICD-10-CM | POA: Diagnosis not present

## 2018-07-27 DIAGNOSIS — R600 Localized edema: Secondary | ICD-10-CM | POA: Diagnosis not present

## 2018-08-10 ENCOUNTER — Ambulatory Visit: Payer: Medicare Other | Admitting: Gastroenterology

## 2018-08-11 DIAGNOSIS — I10 Essential (primary) hypertension: Secondary | ICD-10-CM | POA: Diagnosis not present

## 2018-08-11 DIAGNOSIS — R600 Localized edema: Secondary | ICD-10-CM | POA: Diagnosis not present

## 2018-08-11 DIAGNOSIS — L03115 Cellulitis of right lower limb: Secondary | ICD-10-CM | POA: Diagnosis not present

## 2018-08-11 DIAGNOSIS — R269 Unspecified abnormalities of gait and mobility: Secondary | ICD-10-CM | POA: Diagnosis not present

## 2018-08-31 DIAGNOSIS — I739 Peripheral vascular disease, unspecified: Secondary | ICD-10-CM | POA: Diagnosis not present

## 2018-08-31 DIAGNOSIS — I10 Essential (primary) hypertension: Secondary | ICD-10-CM | POA: Diagnosis not present

## 2018-08-31 DIAGNOSIS — R600 Localized edema: Secondary | ICD-10-CM | POA: Diagnosis not present

## 2018-08-31 DIAGNOSIS — Q858 Other phakomatoses, not elsewhere classified: Secondary | ICD-10-CM | POA: Diagnosis not present

## 2018-09-12 DIAGNOSIS — Z13818 Encounter for screening for other digestive system disorders: Secondary | ICD-10-CM | POA: Diagnosis not present

## 2018-09-12 DIAGNOSIS — Z20828 Contact with and (suspected) exposure to other viral communicable diseases: Secondary | ICD-10-CM | POA: Diagnosis not present

## 2018-09-23 DIAGNOSIS — Q858 Other phakomatoses, not elsewhere classified: Secondary | ICD-10-CM | POA: Diagnosis not present

## 2018-09-23 DIAGNOSIS — R262 Difficulty in walking, not elsewhere classified: Secondary | ICD-10-CM | POA: Diagnosis not present

## 2018-09-23 DIAGNOSIS — R600 Localized edema: Secondary | ICD-10-CM | POA: Diagnosis not present

## 2018-09-23 DIAGNOSIS — M6281 Muscle weakness (generalized): Secondary | ICD-10-CM | POA: Diagnosis not present

## 2018-09-23 DIAGNOSIS — E1151 Type 2 diabetes mellitus with diabetic peripheral angiopathy without gangrene: Secondary | ICD-10-CM | POA: Diagnosis not present

## 2018-09-23 DIAGNOSIS — G40909 Epilepsy, unspecified, not intractable, without status epilepticus: Secondary | ICD-10-CM | POA: Diagnosis not present

## 2018-09-26 DIAGNOSIS — M6281 Muscle weakness (generalized): Secondary | ICD-10-CM | POA: Diagnosis not present

## 2018-09-26 DIAGNOSIS — R262 Difficulty in walking, not elsewhere classified: Secondary | ICD-10-CM | POA: Diagnosis not present

## 2018-09-27 DIAGNOSIS — M6281 Muscle weakness (generalized): Secondary | ICD-10-CM | POA: Diagnosis not present

## 2018-09-27 DIAGNOSIS — R262 Difficulty in walking, not elsewhere classified: Secondary | ICD-10-CM | POA: Diagnosis not present

## 2018-09-28 DIAGNOSIS — R262 Difficulty in walking, not elsewhere classified: Secondary | ICD-10-CM | POA: Diagnosis not present

## 2018-09-28 DIAGNOSIS — M6281 Muscle weakness (generalized): Secondary | ICD-10-CM | POA: Diagnosis not present

## 2018-09-29 DIAGNOSIS — M6281 Muscle weakness (generalized): Secondary | ICD-10-CM | POA: Diagnosis not present

## 2018-09-29 DIAGNOSIS — R262 Difficulty in walking, not elsewhere classified: Secondary | ICD-10-CM | POA: Diagnosis not present

## 2018-09-29 DIAGNOSIS — I739 Peripheral vascular disease, unspecified: Secondary | ICD-10-CM | POA: Diagnosis not present

## 2018-09-29 DIAGNOSIS — B351 Tinea unguium: Secondary | ICD-10-CM | POA: Diagnosis not present

## 2018-09-30 DIAGNOSIS — M6281 Muscle weakness (generalized): Secondary | ICD-10-CM | POA: Diagnosis not present

## 2018-09-30 DIAGNOSIS — R262 Difficulty in walking, not elsewhere classified: Secondary | ICD-10-CM | POA: Diagnosis not present

## 2018-10-03 DIAGNOSIS — R262 Difficulty in walking, not elsewhere classified: Secondary | ICD-10-CM | POA: Diagnosis not present

## 2018-10-03 DIAGNOSIS — M6281 Muscle weakness (generalized): Secondary | ICD-10-CM | POA: Diagnosis not present

## 2018-10-04 DIAGNOSIS — R262 Difficulty in walking, not elsewhere classified: Secondary | ICD-10-CM | POA: Diagnosis not present

## 2018-10-04 DIAGNOSIS — M6281 Muscle weakness (generalized): Secondary | ICD-10-CM | POA: Diagnosis not present

## 2018-10-05 DIAGNOSIS — R262 Difficulty in walking, not elsewhere classified: Secondary | ICD-10-CM | POA: Diagnosis not present

## 2018-10-05 DIAGNOSIS — M6281 Muscle weakness (generalized): Secondary | ICD-10-CM | POA: Diagnosis not present

## 2018-10-06 DIAGNOSIS — M6281 Muscle weakness (generalized): Secondary | ICD-10-CM | POA: Diagnosis not present

## 2018-10-06 DIAGNOSIS — R262 Difficulty in walking, not elsewhere classified: Secondary | ICD-10-CM | POA: Diagnosis not present

## 2018-10-07 DIAGNOSIS — R262 Difficulty in walking, not elsewhere classified: Secondary | ICD-10-CM | POA: Diagnosis not present

## 2018-10-07 DIAGNOSIS — M6281 Muscle weakness (generalized): Secondary | ICD-10-CM | POA: Diagnosis not present

## 2018-10-11 DIAGNOSIS — M6281 Muscle weakness (generalized): Secondary | ICD-10-CM | POA: Diagnosis not present

## 2018-10-11 DIAGNOSIS — R262 Difficulty in walking, not elsewhere classified: Secondary | ICD-10-CM | POA: Diagnosis not present

## 2018-10-12 DIAGNOSIS — F79 Unspecified intellectual disabilities: Secondary | ICD-10-CM | POA: Diagnosis not present

## 2018-10-12 DIAGNOSIS — M6281 Muscle weakness (generalized): Secondary | ICD-10-CM | POA: Diagnosis not present

## 2018-10-12 DIAGNOSIS — E1151 Type 2 diabetes mellitus with diabetic peripheral angiopathy without gangrene: Secondary | ICD-10-CM | POA: Diagnosis not present

## 2018-10-12 DIAGNOSIS — G40909 Epilepsy, unspecified, not intractable, without status epilepticus: Secondary | ICD-10-CM | POA: Diagnosis not present

## 2018-10-12 DIAGNOSIS — R600 Localized edema: Secondary | ICD-10-CM | POA: Diagnosis not present

## 2018-10-12 DIAGNOSIS — Q858 Other phakomatoses, not elsewhere classified: Secondary | ICD-10-CM | POA: Diagnosis not present

## 2018-10-12 DIAGNOSIS — R262 Difficulty in walking, not elsewhere classified: Secondary | ICD-10-CM | POA: Diagnosis not present

## 2018-10-12 DIAGNOSIS — Q8789 Other specified congenital malformation syndromes, not elsewhere classified: Secondary | ICD-10-CM | POA: Diagnosis not present

## 2018-10-13 DIAGNOSIS — R262 Difficulty in walking, not elsewhere classified: Secondary | ICD-10-CM | POA: Diagnosis not present

## 2018-10-13 DIAGNOSIS — M6281 Muscle weakness (generalized): Secondary | ICD-10-CM | POA: Diagnosis not present

## 2018-10-14 DIAGNOSIS — M6281 Muscle weakness (generalized): Secondary | ICD-10-CM | POA: Diagnosis not present

## 2018-10-14 DIAGNOSIS — R262 Difficulty in walking, not elsewhere classified: Secondary | ICD-10-CM | POA: Diagnosis not present

## 2018-10-17 DIAGNOSIS — M6281 Muscle weakness (generalized): Secondary | ICD-10-CM | POA: Diagnosis not present

## 2018-10-17 DIAGNOSIS — R262 Difficulty in walking, not elsewhere classified: Secondary | ICD-10-CM | POA: Diagnosis not present

## 2018-10-18 DIAGNOSIS — M6281 Muscle weakness (generalized): Secondary | ICD-10-CM | POA: Diagnosis not present

## 2018-10-18 DIAGNOSIS — R262 Difficulty in walking, not elsewhere classified: Secondary | ICD-10-CM | POA: Diagnosis not present

## 2018-10-19 DIAGNOSIS — M6281 Muscle weakness (generalized): Secondary | ICD-10-CM | POA: Diagnosis not present

## 2018-10-19 DIAGNOSIS — R262 Difficulty in walking, not elsewhere classified: Secondary | ICD-10-CM | POA: Diagnosis not present

## 2018-10-20 DIAGNOSIS — R262 Difficulty in walking, not elsewhere classified: Secondary | ICD-10-CM | POA: Diagnosis not present

## 2018-10-20 DIAGNOSIS — M6281 Muscle weakness (generalized): Secondary | ICD-10-CM | POA: Diagnosis not present

## 2018-10-21 DIAGNOSIS — R262 Difficulty in walking, not elsewhere classified: Secondary | ICD-10-CM | POA: Diagnosis not present

## 2018-10-21 DIAGNOSIS — M6281 Muscle weakness (generalized): Secondary | ICD-10-CM | POA: Diagnosis not present

## 2018-10-24 DIAGNOSIS — M6281 Muscle weakness (generalized): Secondary | ICD-10-CM | POA: Diagnosis not present

## 2018-10-24 DIAGNOSIS — R262 Difficulty in walking, not elsewhere classified: Secondary | ICD-10-CM | POA: Diagnosis not present

## 2018-10-25 DIAGNOSIS — R262 Difficulty in walking, not elsewhere classified: Secondary | ICD-10-CM | POA: Diagnosis not present

## 2018-10-25 DIAGNOSIS — M6281 Muscle weakness (generalized): Secondary | ICD-10-CM | POA: Diagnosis not present

## 2018-10-25 DIAGNOSIS — D649 Anemia, unspecified: Secondary | ICD-10-CM | POA: Diagnosis not present

## 2018-10-25 DIAGNOSIS — E119 Type 2 diabetes mellitus without complications: Secondary | ICD-10-CM | POA: Diagnosis not present

## 2018-10-25 DIAGNOSIS — Z79899 Other long term (current) drug therapy: Secondary | ICD-10-CM | POA: Diagnosis not present

## 2018-10-26 ENCOUNTER — Ambulatory Visit: Payer: Medicare Other | Admitting: Gastroenterology

## 2018-10-26 DIAGNOSIS — M6281 Muscle weakness (generalized): Secondary | ICD-10-CM | POA: Diagnosis not present

## 2018-10-26 DIAGNOSIS — R262 Difficulty in walking, not elsewhere classified: Secondary | ICD-10-CM | POA: Diagnosis not present

## 2018-10-27 DIAGNOSIS — R262 Difficulty in walking, not elsewhere classified: Secondary | ICD-10-CM | POA: Diagnosis not present

## 2018-10-27 DIAGNOSIS — M6281 Muscle weakness (generalized): Secondary | ICD-10-CM | POA: Diagnosis not present

## 2018-10-28 DIAGNOSIS — R262 Difficulty in walking, not elsewhere classified: Secondary | ICD-10-CM | POA: Diagnosis not present

## 2018-10-28 DIAGNOSIS — M6281 Muscle weakness (generalized): Secondary | ICD-10-CM | POA: Diagnosis not present

## 2018-11-01 DIAGNOSIS — R262 Difficulty in walking, not elsewhere classified: Secondary | ICD-10-CM | POA: Diagnosis not present

## 2018-11-01 DIAGNOSIS — Z794 Long term (current) use of insulin: Secondary | ICD-10-CM | POA: Diagnosis not present

## 2018-11-01 DIAGNOSIS — M6281 Muscle weakness (generalized): Secondary | ICD-10-CM | POA: Diagnosis not present

## 2018-11-01 DIAGNOSIS — E119 Type 2 diabetes mellitus without complications: Secondary | ICD-10-CM | POA: Diagnosis not present

## 2018-11-01 DIAGNOSIS — Z7984 Long term (current) use of oral hypoglycemic drugs: Secondary | ICD-10-CM | POA: Diagnosis not present

## 2018-11-01 DIAGNOSIS — H2513 Age-related nuclear cataract, bilateral: Secondary | ICD-10-CM | POA: Diagnosis not present

## 2018-11-13 DIAGNOSIS — R600 Localized edema: Secondary | ICD-10-CM | POA: Diagnosis not present

## 2018-11-13 DIAGNOSIS — E559 Vitamin D deficiency, unspecified: Secondary | ICD-10-CM | POA: Diagnosis not present

## 2018-11-13 DIAGNOSIS — Z9181 History of falling: Secondary | ICD-10-CM | POA: Diagnosis not present

## 2018-11-13 DIAGNOSIS — Q858 Other phakomatoses, not elsewhere classified: Secondary | ICD-10-CM | POA: Diagnosis not present

## 2018-11-13 DIAGNOSIS — G40909 Epilepsy, unspecified, not intractable, without status epilepticus: Secondary | ICD-10-CM | POA: Diagnosis not present

## 2018-11-13 DIAGNOSIS — F79 Unspecified intellectual disabilities: Secondary | ICD-10-CM | POA: Diagnosis not present

## 2018-11-13 DIAGNOSIS — E039 Hypothyroidism, unspecified: Secondary | ICD-10-CM | POA: Diagnosis not present

## 2018-11-13 DIAGNOSIS — E1151 Type 2 diabetes mellitus with diabetic peripheral angiopathy without gangrene: Secondary | ICD-10-CM | POA: Diagnosis not present

## 2018-11-24 DIAGNOSIS — Z20828 Contact with and (suspected) exposure to other viral communicable diseases: Secondary | ICD-10-CM | POA: Diagnosis not present

## 2018-11-24 DIAGNOSIS — Z1383 Encounter for screening for respiratory disorder NEC: Secondary | ICD-10-CM | POA: Diagnosis not present

## 2018-12-05 DIAGNOSIS — Z20828 Contact with and (suspected) exposure to other viral communicable diseases: Secondary | ICD-10-CM | POA: Diagnosis not present

## 2018-12-05 DIAGNOSIS — Z1383 Encounter for screening for respiratory disorder NEC: Secondary | ICD-10-CM | POA: Diagnosis not present

## 2018-12-07 DIAGNOSIS — R293 Abnormal posture: Secondary | ICD-10-CM | POA: Diagnosis not present

## 2018-12-07 DIAGNOSIS — I10 Essential (primary) hypertension: Secondary | ICD-10-CM | POA: Diagnosis not present

## 2018-12-07 DIAGNOSIS — Q858 Other phakomatoses, not elsewhere classified: Secondary | ICD-10-CM | POA: Diagnosis not present

## 2018-12-07 DIAGNOSIS — G40909 Epilepsy, unspecified, not intractable, without status epilepticus: Secondary | ICD-10-CM | POA: Diagnosis not present

## 2018-12-07 DIAGNOSIS — E119 Type 2 diabetes mellitus without complications: Secondary | ICD-10-CM | POA: Diagnosis not present

## 2018-12-07 DIAGNOSIS — E785 Hyperlipidemia, unspecified: Secondary | ICD-10-CM | POA: Diagnosis not present

## 2018-12-07 DIAGNOSIS — R279 Unspecified lack of coordination: Secondary | ICD-10-CM | POA: Diagnosis not present

## 2018-12-07 DIAGNOSIS — Z743 Need for continuous supervision: Secondary | ICD-10-CM | POA: Diagnosis not present

## 2018-12-07 DIAGNOSIS — R41841 Cognitive communication deficit: Secondary | ICD-10-CM | POA: Diagnosis not present

## 2018-12-07 DIAGNOSIS — B351 Tinea unguium: Secondary | ICD-10-CM | POA: Diagnosis not present

## 2018-12-07 DIAGNOSIS — Z9181 History of falling: Secondary | ICD-10-CM | POA: Diagnosis not present

## 2018-12-07 DIAGNOSIS — K219 Gastro-esophageal reflux disease without esophagitis: Secondary | ICD-10-CM | POA: Diagnosis not present

## 2018-12-07 DIAGNOSIS — L03115 Cellulitis of right lower limb: Secondary | ICD-10-CM | POA: Diagnosis not present

## 2018-12-07 DIAGNOSIS — E1151 Type 2 diabetes mellitus with diabetic peripheral angiopathy without gangrene: Secondary | ICD-10-CM | POA: Diagnosis not present

## 2018-12-07 DIAGNOSIS — R6250 Unspecified lack of expected normal physiological development in childhood: Secondary | ICD-10-CM | POA: Diagnosis not present

## 2018-12-07 DIAGNOSIS — R2230 Localized swelling, mass and lump, unspecified upper limb: Secondary | ICD-10-CM | POA: Diagnosis not present

## 2018-12-07 DIAGNOSIS — L603 Nail dystrophy: Secondary | ICD-10-CM | POA: Diagnosis not present

## 2018-12-07 DIAGNOSIS — F79 Unspecified intellectual disabilities: Secondary | ICD-10-CM | POA: Diagnosis not present

## 2018-12-07 DIAGNOSIS — R600 Localized edema: Secondary | ICD-10-CM | POA: Diagnosis not present

## 2018-12-07 DIAGNOSIS — U071 COVID-19: Secondary | ICD-10-CM | POA: Diagnosis not present

## 2018-12-07 DIAGNOSIS — I739 Peripheral vascular disease, unspecified: Secondary | ICD-10-CM | POA: Diagnosis not present

## 2018-12-07 DIAGNOSIS — E1140 Type 2 diabetes mellitus with diabetic neuropathy, unspecified: Secondary | ICD-10-CM | POA: Diagnosis not present

## 2018-12-07 DIAGNOSIS — R262 Difficulty in walking, not elsewhere classified: Secondary | ICD-10-CM | POA: Diagnosis not present

## 2018-12-07 DIAGNOSIS — M109 Gout, unspecified: Secondary | ICD-10-CM | POA: Diagnosis not present

## 2018-12-07 DIAGNOSIS — E559 Vitamin D deficiency, unspecified: Secondary | ICD-10-CM | POA: Diagnosis not present

## 2018-12-07 DIAGNOSIS — M2550 Pain in unspecified joint: Secondary | ICD-10-CM | POA: Diagnosis not present

## 2018-12-07 DIAGNOSIS — R609 Edema, unspecified: Secondary | ICD-10-CM | POA: Diagnosis not present

## 2018-12-07 DIAGNOSIS — E039 Hypothyroidism, unspecified: Secondary | ICD-10-CM | POA: Diagnosis not present

## 2018-12-07 DIAGNOSIS — R269 Unspecified abnormalities of gait and mobility: Secondary | ICD-10-CM | POA: Diagnosis not present

## 2018-12-07 DIAGNOSIS — R1312 Dysphagia, oropharyngeal phase: Secondary | ICD-10-CM | POA: Diagnosis not present

## 2018-12-07 DIAGNOSIS — E781 Pure hyperglyceridemia: Secondary | ICD-10-CM | POA: Diagnosis not present

## 2018-12-07 DIAGNOSIS — R220 Localized swelling, mass and lump, head: Secondary | ICD-10-CM | POA: Diagnosis not present

## 2018-12-07 DIAGNOSIS — Q8789 Other specified congenital malformation syndromes, not elsewhere classified: Secondary | ICD-10-CM | POA: Diagnosis not present

## 2018-12-14 DIAGNOSIS — Q8789 Other specified congenital malformation syndromes, not elsewhere classified: Secondary | ICD-10-CM | POA: Diagnosis not present

## 2018-12-14 DIAGNOSIS — E039 Hypothyroidism, unspecified: Secondary | ICD-10-CM | POA: Diagnosis not present

## 2018-12-14 DIAGNOSIS — Z9181 History of falling: Secondary | ICD-10-CM | POA: Diagnosis not present

## 2018-12-14 DIAGNOSIS — E785 Hyperlipidemia, unspecified: Secondary | ICD-10-CM | POA: Diagnosis not present

## 2018-12-14 DIAGNOSIS — E559 Vitamin D deficiency, unspecified: Secondary | ICD-10-CM | POA: Diagnosis not present

## 2018-12-14 DIAGNOSIS — F79 Unspecified intellectual disabilities: Secondary | ICD-10-CM | POA: Diagnosis not present

## 2018-12-14 DIAGNOSIS — U071 COVID-19: Secondary | ICD-10-CM | POA: Diagnosis not present

## 2018-12-14 DIAGNOSIS — G40909 Epilepsy, unspecified, not intractable, without status epilepticus: Secondary | ICD-10-CM | POA: Diagnosis not present

## 2018-12-14 DIAGNOSIS — R6250 Unspecified lack of expected normal physiological development in childhood: Secondary | ICD-10-CM | POA: Diagnosis not present

## 2018-12-16 DIAGNOSIS — G40909 Epilepsy, unspecified, not intractable, without status epilepticus: Secondary | ICD-10-CM | POA: Diagnosis not present

## 2018-12-16 DIAGNOSIS — R600 Localized edema: Secondary | ICD-10-CM | POA: Diagnosis not present

## 2018-12-16 DIAGNOSIS — Q858 Other phakomatoses, not elsewhere classified: Secondary | ICD-10-CM | POA: Diagnosis not present

## 2018-12-16 DIAGNOSIS — U071 COVID-19: Secondary | ICD-10-CM | POA: Diagnosis not present

## 2018-12-21 DIAGNOSIS — G40909 Epilepsy, unspecified, not intractable, without status epilepticus: Secondary | ICD-10-CM | POA: Diagnosis not present

## 2018-12-21 DIAGNOSIS — Q858 Other phakomatoses, not elsewhere classified: Secondary | ICD-10-CM | POA: Diagnosis not present

## 2018-12-21 DIAGNOSIS — U071 COVID-19: Secondary | ICD-10-CM | POA: Diagnosis not present

## 2018-12-21 DIAGNOSIS — R600 Localized edema: Secondary | ICD-10-CM | POA: Diagnosis not present

## 2018-12-23 DIAGNOSIS — E119 Type 2 diabetes mellitus without complications: Secondary | ICD-10-CM | POA: Diagnosis not present

## 2018-12-23 DIAGNOSIS — Z9181 History of falling: Secondary | ICD-10-CM | POA: Diagnosis not present

## 2018-12-23 DIAGNOSIS — Q8789 Other specified congenital malformation syndromes, not elsewhere classified: Secondary | ICD-10-CM | POA: Diagnosis not present

## 2018-12-23 DIAGNOSIS — M6281 Muscle weakness (generalized): Secondary | ICD-10-CM | POA: Diagnosis not present

## 2018-12-24 DIAGNOSIS — E119 Type 2 diabetes mellitus without complications: Secondary | ICD-10-CM | POA: Diagnosis not present

## 2018-12-24 DIAGNOSIS — Q8789 Other specified congenital malformation syndromes, not elsewhere classified: Secondary | ICD-10-CM | POA: Diagnosis not present

## 2018-12-24 DIAGNOSIS — M6281 Muscle weakness (generalized): Secondary | ICD-10-CM | POA: Diagnosis not present

## 2018-12-24 DIAGNOSIS — Z9181 History of falling: Secondary | ICD-10-CM | POA: Diagnosis not present

## 2018-12-25 DIAGNOSIS — U071 COVID-19: Secondary | ICD-10-CM | POA: Diagnosis not present

## 2018-12-25 DIAGNOSIS — Q8789 Other specified congenital malformation syndromes, not elsewhere classified: Secondary | ICD-10-CM | POA: Diagnosis not present

## 2018-12-25 DIAGNOSIS — M6281 Muscle weakness (generalized): Secondary | ICD-10-CM | POA: Diagnosis not present

## 2018-12-25 DIAGNOSIS — R600 Localized edema: Secondary | ICD-10-CM | POA: Diagnosis not present

## 2018-12-25 DIAGNOSIS — E1151 Type 2 diabetes mellitus with diabetic peripheral angiopathy without gangrene: Secondary | ICD-10-CM | POA: Diagnosis not present

## 2018-12-25 DIAGNOSIS — G40909 Epilepsy, unspecified, not intractable, without status epilepticus: Secondary | ICD-10-CM | POA: Diagnosis not present

## 2018-12-25 DIAGNOSIS — E119 Type 2 diabetes mellitus without complications: Secondary | ICD-10-CM | POA: Diagnosis not present

## 2018-12-25 DIAGNOSIS — Q858 Other phakomatoses, not elsewhere classified: Secondary | ICD-10-CM | POA: Diagnosis not present

## 2018-12-25 DIAGNOSIS — Z9181 History of falling: Secondary | ICD-10-CM | POA: Diagnosis not present

## 2018-12-26 DIAGNOSIS — Q8789 Other specified congenital malformation syndromes, not elsewhere classified: Secondary | ICD-10-CM | POA: Diagnosis not present

## 2018-12-26 DIAGNOSIS — M6281 Muscle weakness (generalized): Secondary | ICD-10-CM | POA: Diagnosis not present

## 2018-12-26 DIAGNOSIS — E119 Type 2 diabetes mellitus without complications: Secondary | ICD-10-CM | POA: Diagnosis not present

## 2018-12-26 DIAGNOSIS — Z9181 History of falling: Secondary | ICD-10-CM | POA: Diagnosis not present

## 2018-12-27 DIAGNOSIS — M6281 Muscle weakness (generalized): Secondary | ICD-10-CM | POA: Diagnosis not present

## 2018-12-27 DIAGNOSIS — Z9181 History of falling: Secondary | ICD-10-CM | POA: Diagnosis not present

## 2018-12-27 DIAGNOSIS — Q8789 Other specified congenital malformation syndromes, not elsewhere classified: Secondary | ICD-10-CM | POA: Diagnosis not present

## 2018-12-27 DIAGNOSIS — E119 Type 2 diabetes mellitus without complications: Secondary | ICD-10-CM | POA: Diagnosis not present

## 2018-12-28 DIAGNOSIS — E119 Type 2 diabetes mellitus without complications: Secondary | ICD-10-CM | POA: Diagnosis not present

## 2018-12-28 DIAGNOSIS — Z9181 History of falling: Secondary | ICD-10-CM | POA: Diagnosis not present

## 2018-12-28 DIAGNOSIS — M6281 Muscle weakness (generalized): Secondary | ICD-10-CM | POA: Diagnosis not present

## 2018-12-28 DIAGNOSIS — Q8789 Other specified congenital malformation syndromes, not elsewhere classified: Secondary | ICD-10-CM | POA: Diagnosis not present

## 2018-12-29 DIAGNOSIS — Q8789 Other specified congenital malformation syndromes, not elsewhere classified: Secondary | ICD-10-CM | POA: Diagnosis not present

## 2018-12-29 DIAGNOSIS — U071 COVID-19: Secondary | ICD-10-CM | POA: Diagnosis not present

## 2018-12-29 DIAGNOSIS — R633 Feeding difficulties: Secondary | ICD-10-CM | POA: Diagnosis not present

## 2018-12-29 DIAGNOSIS — M6281 Muscle weakness (generalized): Secondary | ICD-10-CM | POA: Diagnosis not present

## 2018-12-29 DIAGNOSIS — E119 Type 2 diabetes mellitus without complications: Secondary | ICD-10-CM | POA: Diagnosis not present

## 2018-12-29 DIAGNOSIS — B029 Zoster without complications: Secondary | ICD-10-CM | POA: Diagnosis not present

## 2018-12-29 DIAGNOSIS — Z9181 History of falling: Secondary | ICD-10-CM | POA: Diagnosis not present

## 2018-12-29 DIAGNOSIS — L0390 Cellulitis, unspecified: Secondary | ICD-10-CM | POA: Diagnosis not present

## 2018-12-30 DIAGNOSIS — Q8789 Other specified congenital malformation syndromes, not elsewhere classified: Secondary | ICD-10-CM | POA: Diagnosis not present

## 2018-12-30 DIAGNOSIS — M6281 Muscle weakness (generalized): Secondary | ICD-10-CM | POA: Diagnosis not present

## 2018-12-30 DIAGNOSIS — E119 Type 2 diabetes mellitus without complications: Secondary | ICD-10-CM | POA: Diagnosis not present

## 2018-12-30 DIAGNOSIS — Z9181 History of falling: Secondary | ICD-10-CM | POA: Diagnosis not present

## 2018-12-31 DIAGNOSIS — Q8789 Other specified congenital malformation syndromes, not elsewhere classified: Secondary | ICD-10-CM | POA: Diagnosis not present

## 2018-12-31 DIAGNOSIS — E119 Type 2 diabetes mellitus without complications: Secondary | ICD-10-CM | POA: Diagnosis not present

## 2018-12-31 DIAGNOSIS — M6281 Muscle weakness (generalized): Secondary | ICD-10-CM | POA: Diagnosis not present

## 2018-12-31 DIAGNOSIS — Z9181 History of falling: Secondary | ICD-10-CM | POA: Diagnosis not present

## 2019-01-01 DIAGNOSIS — Z9181 History of falling: Secondary | ICD-10-CM | POA: Diagnosis not present

## 2019-01-01 DIAGNOSIS — E119 Type 2 diabetes mellitus without complications: Secondary | ICD-10-CM | POA: Diagnosis not present

## 2019-01-01 DIAGNOSIS — Q8789 Other specified congenital malformation syndromes, not elsewhere classified: Secondary | ICD-10-CM | POA: Diagnosis not present

## 2019-01-01 DIAGNOSIS — M6281 Muscle weakness (generalized): Secondary | ICD-10-CM | POA: Diagnosis not present

## 2019-01-02 DIAGNOSIS — E119 Type 2 diabetes mellitus without complications: Secondary | ICD-10-CM | POA: Diagnosis not present

## 2019-01-02 DIAGNOSIS — Z9181 History of falling: Secondary | ICD-10-CM | POA: Diagnosis not present

## 2019-01-02 DIAGNOSIS — M6281 Muscle weakness (generalized): Secondary | ICD-10-CM | POA: Diagnosis not present

## 2019-01-02 DIAGNOSIS — Q8789 Other specified congenital malformation syndromes, not elsewhere classified: Secondary | ICD-10-CM | POA: Diagnosis not present

## 2019-01-03 DIAGNOSIS — Z9181 History of falling: Secondary | ICD-10-CM | POA: Diagnosis not present

## 2019-01-03 DIAGNOSIS — Q8789 Other specified congenital malformation syndromes, not elsewhere classified: Secondary | ICD-10-CM | POA: Diagnosis not present

## 2019-01-03 DIAGNOSIS — M6281 Muscle weakness (generalized): Secondary | ICD-10-CM | POA: Diagnosis not present

## 2019-01-03 DIAGNOSIS — E119 Type 2 diabetes mellitus without complications: Secondary | ICD-10-CM | POA: Diagnosis not present

## 2019-01-04 DIAGNOSIS — E119 Type 2 diabetes mellitus without complications: Secondary | ICD-10-CM | POA: Diagnosis not present

## 2019-01-04 DIAGNOSIS — Q8789 Other specified congenital malformation syndromes, not elsewhere classified: Secondary | ICD-10-CM | POA: Diagnosis not present

## 2019-01-04 DIAGNOSIS — M6281 Muscle weakness (generalized): Secondary | ICD-10-CM | POA: Diagnosis not present

## 2019-01-04 DIAGNOSIS — Z9181 History of falling: Secondary | ICD-10-CM | POA: Diagnosis not present

## 2019-01-05 DIAGNOSIS — Z9181 History of falling: Secondary | ICD-10-CM | POA: Diagnosis not present

## 2019-01-05 DIAGNOSIS — E119 Type 2 diabetes mellitus without complications: Secondary | ICD-10-CM | POA: Diagnosis not present

## 2019-01-05 DIAGNOSIS — M6281 Muscle weakness (generalized): Secondary | ICD-10-CM | POA: Diagnosis not present

## 2019-01-05 DIAGNOSIS — Q8789 Other specified congenital malformation syndromes, not elsewhere classified: Secondary | ICD-10-CM | POA: Diagnosis not present

## 2019-01-06 DIAGNOSIS — M6281 Muscle weakness (generalized): Secondary | ICD-10-CM | POA: Diagnosis not present

## 2019-01-06 DIAGNOSIS — Q8789 Other specified congenital malformation syndromes, not elsewhere classified: Secondary | ICD-10-CM | POA: Diagnosis not present

## 2019-01-06 DIAGNOSIS — Z9181 History of falling: Secondary | ICD-10-CM | POA: Diagnosis not present

## 2019-01-06 DIAGNOSIS — E119 Type 2 diabetes mellitus without complications: Secondary | ICD-10-CM | POA: Diagnosis not present

## 2019-01-07 DIAGNOSIS — Q8789 Other specified congenital malformation syndromes, not elsewhere classified: Secondary | ICD-10-CM | POA: Diagnosis not present

## 2019-01-07 DIAGNOSIS — M6281 Muscle weakness (generalized): Secondary | ICD-10-CM | POA: Diagnosis not present

## 2019-01-07 DIAGNOSIS — Z9181 History of falling: Secondary | ICD-10-CM | POA: Diagnosis not present

## 2019-01-07 DIAGNOSIS — E119 Type 2 diabetes mellitus without complications: Secondary | ICD-10-CM | POA: Diagnosis not present

## 2019-01-08 DIAGNOSIS — Z9181 History of falling: Secondary | ICD-10-CM | POA: Diagnosis not present

## 2019-01-08 DIAGNOSIS — M6281 Muscle weakness (generalized): Secondary | ICD-10-CM | POA: Diagnosis not present

## 2019-01-08 DIAGNOSIS — E119 Type 2 diabetes mellitus without complications: Secondary | ICD-10-CM | POA: Diagnosis not present

## 2019-01-08 DIAGNOSIS — Q8789 Other specified congenital malformation syndromes, not elsewhere classified: Secondary | ICD-10-CM | POA: Diagnosis not present

## 2019-01-09 DIAGNOSIS — M6281 Muscle weakness (generalized): Secondary | ICD-10-CM | POA: Diagnosis not present

## 2019-01-09 DIAGNOSIS — E119 Type 2 diabetes mellitus without complications: Secondary | ICD-10-CM | POA: Diagnosis not present

## 2019-01-09 DIAGNOSIS — Q8789 Other specified congenital malformation syndromes, not elsewhere classified: Secondary | ICD-10-CM | POA: Diagnosis not present

## 2019-01-09 DIAGNOSIS — Z9181 History of falling: Secondary | ICD-10-CM | POA: Diagnosis not present

## 2019-01-10 DIAGNOSIS — E119 Type 2 diabetes mellitus without complications: Secondary | ICD-10-CM | POA: Diagnosis not present

## 2019-01-10 DIAGNOSIS — M6281 Muscle weakness (generalized): Secondary | ICD-10-CM | POA: Diagnosis not present

## 2019-01-10 DIAGNOSIS — Q8789 Other specified congenital malformation syndromes, not elsewhere classified: Secondary | ICD-10-CM | POA: Diagnosis not present

## 2019-01-10 DIAGNOSIS — Z9181 History of falling: Secondary | ICD-10-CM | POA: Diagnosis not present

## 2019-01-11 DIAGNOSIS — E119 Type 2 diabetes mellitus without complications: Secondary | ICD-10-CM | POA: Diagnosis not present

## 2019-01-11 DIAGNOSIS — Q8789 Other specified congenital malformation syndromes, not elsewhere classified: Secondary | ICD-10-CM | POA: Diagnosis not present

## 2019-01-11 DIAGNOSIS — E039 Hypothyroidism, unspecified: Secondary | ICD-10-CM | POA: Diagnosis not present

## 2019-01-11 DIAGNOSIS — M6281 Muscle weakness (generalized): Secondary | ICD-10-CM | POA: Diagnosis not present

## 2019-01-11 DIAGNOSIS — Z9181 History of falling: Secondary | ICD-10-CM | POA: Diagnosis not present

## 2019-01-12 DIAGNOSIS — Q8789 Other specified congenital malformation syndromes, not elsewhere classified: Secondary | ICD-10-CM | POA: Diagnosis not present

## 2019-01-12 DIAGNOSIS — Z9181 History of falling: Secondary | ICD-10-CM | POA: Diagnosis not present

## 2019-01-12 DIAGNOSIS — M6281 Muscle weakness (generalized): Secondary | ICD-10-CM | POA: Diagnosis not present

## 2019-01-12 DIAGNOSIS — E1140 Type 2 diabetes mellitus with diabetic neuropathy, unspecified: Secondary | ICD-10-CM | POA: Diagnosis not present

## 2019-01-13 ENCOUNTER — Encounter (HOSPITAL_COMMUNITY): Payer: Self-pay

## 2019-01-13 ENCOUNTER — Emergency Department (HOSPITAL_COMMUNITY)
Admission: EM | Admit: 2019-01-13 | Discharge: 2019-01-13 | Disposition: A | Discharge status: Home / Self Care | Payer: Medicare Other | Attending: Emergency Medicine | Admitting: Emergency Medicine

## 2019-01-13 DIAGNOSIS — U071 COVID-19: Secondary | ICD-10-CM | POA: Diagnosis not present

## 2019-01-13 DIAGNOSIS — R220 Localized swelling, mass and lump, head: Secondary | ICD-10-CM | POA: Diagnosis not present

## 2019-01-13 DIAGNOSIS — Z79899 Other long term (current) drug therapy: Secondary | ICD-10-CM | POA: Insufficient documentation

## 2019-01-13 DIAGNOSIS — E119 Type 2 diabetes mellitus without complications: Secondary | ICD-10-CM | POA: Insufficient documentation

## 2019-01-13 DIAGNOSIS — I10 Essential (primary) hypertension: Secondary | ICD-10-CM | POA: Insufficient documentation

## 2019-01-13 DIAGNOSIS — R21 Rash and other nonspecific skin eruption: Secondary | ICD-10-CM | POA: Insufficient documentation

## 2019-01-13 DIAGNOSIS — Z7982 Long term (current) use of aspirin: Secondary | ICD-10-CM | POA: Insufficient documentation

## 2019-01-13 DIAGNOSIS — M2550 Pain in unspecified joint: Secondary | ICD-10-CM | POA: Diagnosis not present

## 2019-01-13 DIAGNOSIS — Z7401 Bed confinement status: Secondary | ICD-10-CM | POA: Diagnosis not present

## 2019-01-13 DIAGNOSIS — F99 Mental disorder, not otherwise specified: Secondary | ICD-10-CM | POA: Insufficient documentation

## 2019-01-13 DIAGNOSIS — Z7984 Long term (current) use of oral hypoglycemic drugs: Secondary | ICD-10-CM | POA: Insufficient documentation

## 2019-01-13 LAB — CBC WITH DIFFERENTIAL/PLATELET
Abs Immature Granulocytes: 0.04 10*3/uL (ref 0.00–0.07)
Basophils Absolute: 0.1 10*3/uL (ref 0.0–0.1)
Basophils Relative: 1 %
Eosinophils Absolute: 0.5 10*3/uL (ref 0.0–0.5)
Eosinophils Relative: 5 %
HCT: 45.7 % (ref 39.0–52.0)
Hemoglobin: 15.2 g/dL (ref 13.0–17.0)
Immature Granulocytes: 1 %
Lymphocytes Relative: 21 %
Lymphs Abs: 1.8 10*3/uL (ref 0.7–4.0)
MCH: 31.7 pg (ref 26.0–34.0)
MCHC: 33.3 g/dL (ref 30.0–36.0)
MCV: 95.4 fL (ref 80.0–100.0)
Monocytes Absolute: 0.9 10*3/uL (ref 0.1–1.0)
Monocytes Relative: 10 %
Neutro Abs: 5.5 10*3/uL (ref 1.7–7.7)
Neutrophils Relative %: 62 %
Platelets: 153 10*3/uL (ref 150–400)
RBC: 4.79 MIL/uL (ref 4.22–5.81)
RDW: 13.4 % (ref 11.5–15.5)
WBC: 8.8 10*3/uL (ref 4.0–10.5)
nRBC: 0 % (ref 0.0–0.2)

## 2019-01-13 LAB — PROTIME-INR
INR: 1.1 (ref 0.8–1.2)
Prothrombin Time: 14.1 seconds (ref 11.4–15.2)

## 2019-01-13 LAB — COMPREHENSIVE METABOLIC PANEL
ALT: 23 U/L (ref 0–44)
AST: 21 U/L (ref 15–41)
Albumin: 3.3 g/dL — ABNORMAL LOW (ref 3.5–5.0)
Alkaline Phosphatase: 72 U/L (ref 38–126)
Anion gap: 8 (ref 5–15)
BUN: 10 mg/dL (ref 8–23)
CO2: 31 mmol/L (ref 22–32)
Calcium: 9.2 mg/dL (ref 8.9–10.3)
Chloride: 100 mmol/L (ref 98–111)
Creatinine, Ser: 0.42 mg/dL — ABNORMAL LOW (ref 0.61–1.24)
GFR calc Af Amer: >60 mL/min (ref >60)
GFR calc non Af Amer: >60 mL/min (ref >60)
Glucose, Bld: 108 mg/dL — ABNORMAL HIGH (ref 70–99)
Potassium: 3.9 mmol/L (ref 3.5–5.1)
Sodium: 139 mmol/L (ref 135–145)
Total Bilirubin: 0.6 mg/dL (ref 0.3–1.2)
Total Protein: 6.4 g/dL — ABNORMAL LOW (ref 6.5–8.1)

## 2019-01-13 LAB — APTT: aPTT: 29 seconds (ref 24–36)

## 2019-01-13 MED ORDER — SODIUM CHLORIDE 0.9 % IV SOLN
INTRAVENOUS | Status: DC
  Administered 2019-01-13: 17:00:00 via INTRAVENOUS

## 2019-01-13 NOTE — ED Notes (Signed)
Supervisor at [employer] understanding of discharge instructions. Opportunity for questioning and answers were provided. Armband removed by staff, pt discharged from ED

## 2019-01-13 NOTE — Discharge Instructions (Addendum)
Please have this patient follow-up with a dermatologist

## 2019-01-13 NOTE — ED Triage Notes (Signed)
Pt arries via [employer].  Pt tested positive for covid 01/07/2019, pt was sent in today for unexplained rash.

## 2019-01-13 NOTE — ED Provider Notes (Signed)
[place] EMERGENCY DEPARTMENT Provider Note   CSN: [id number] Arrival date & time: 01/13/19  1515     History   Chief Complaint Chief Complaint  Patient presents with  . Rash    HPI Gary Richard is a 63 y.o. male.     63 year old male who presents with rash times several days.  Patient has a history of Sturge-Weber syndrome.  The rash is mostly localized to the outer portions of his bilateral lower extremities.  No trunk or upper extremity involvement.  Patient states that it is not pruritic.  Denies any new medications.  No prior history of same.  Was diagnosed with COVID several days ago.  Denies any fever, cough or congestion.  Transported by EMS     Past Medical History:  Diagnosis Date  . DM (diabetes mellitus) ([place])   . Gout   . HTN (hypertension)   . Hypertriglyceridemia   . Mental retardation   . Sturge-Weber syndrome [facility])     Patient Active Problem List   Diagnosis Date Noted  . Mucosal abnormality of stomach   . Hiatal hernia   . Reflux esophagitis   . History of colonic polyps   . Diverticulosis of colon without hemorrhage   . Weight loss, unintentional 09/24/2015  . Rectal bleeding 09/24/2015    Past Surgical History:  Procedure Laterality Date  . COLONOSCOPY WITH PROPOFOL N/A 09/30/2015   RMR: Diverticulosis in the entire colon, 2 polyps removed, pathology with sessile serrated adenoma with cytologic dysplasia.Marland Kitchen Next colonoscopy recommended for June 2019  . ESOPHAGOGASTRODUODENOSCOPY (EGD) WITH PROPOFOL N/A 09/30/2015   RMR: LA grade a esophagitis, gastritis, pathology with focal chronic inactive gastritis, no H pylori.  Marland Kitchen PORTACATH PLACEMENT Right    years ago  . TONGUE SURGERY          Home Medications    Prior to Admission medications   Medication Sig Start Date End Date Taking? Authorizing Provider  AMINO ACIDS-PROTEIN HYDROLYS PO Take 30 mLs by mouth daily.    [provider]  aspirin 81 MG tablet Take 81  mg by mouth daily.    [provider]  bisacodyl (DULCOLAX) 5 MG EC tablet Take 10 mg by mouth once. Give once on 02/02/18 for colonoscopy prep.    [provider]  CHLORHEXIDINE GLUCONATE, BULK, SOLN Take 10 mLs by mouth daily.     [provider]  Cholecalciferol (VITAMIN D3) 2000 units TABS Take 2,000 Units by mouth every morning.    [provider]  clotrimazole-betamethasone (LOTRISONE) cream Apply 1 application topically 2 (two) times daily. Apply to lower back two times a day for fungal rash    [provider]  furosemide (LASIX) 20 MG tablet Take 20 mg by mouth daily.    [provider]  hydrochlorothiazide (MICROZIDE) 12.5 MG capsule Take 12.5 mg by mouth daily.    [provider]  levothyroxine (SYNTHROID, LEVOTHROID) 100 MCG tablet Take 100 mcg by mouth at bedtime.    [provider]  metFORMIN (GLUCOPHAGE) 500 MG tablet Take 500 mg by mouth daily with breakfast.    [provider]  Multiple Vitamins-Minerals (CERTAGEN) tablet Take 1 tablet by mouth daily.    [provider]  Omega-3 Fatty Acids (FISH OIL) 1000 MG CAPS Take 1,000 mg by mouth daily.    [provider]  omeprazole (PRILOSEC) 40 MG capsule Take 40 mg by mouth daily before breakfast.     [provider]  potassium chloride (K-DUR) 10 MEQ tablet Take 4 tablets (40 mEq total) by mouth 2 (two) times daily for 4 days. 01/28/18 02/01/18  Annitta Needs, NP  pravastatin (PRAVACHOL) 10 MG tablet Take 10 mg by mouth at bedtime.     [provider]  sodium phosphate (FLEET) 7-19 GM/118ML ENEM Place 1 enema rectally once. Give once on 02/03/18 for colonscopy prep.    [provider]  vitamin C (ASCORBIC ACID) 500 MG tablet Take 500 mg by mouth 2 (two) times daily.    [provider]  white petrolatum (VASELINE) GEL Apply 1 application topically every 8 (eight) hours as needed for lip care.    [provider]    Family History Family History  Problem Relation Age of Onset  . Other Other        unknown    Social History Social History   Tobacco Use  . Smoking status: Never Smoker  . Smokeless tobacco: Never Used  Substance Use Topics  . Alcohol use: No    Alcohol/week: 0.0 standard drinks  . Drug use: No     Allergies   Patient has no known allergies.   Review of Systems Review of Systems  All other systems reviewed and are negative.    Physical Exam Updated Vital Signs BP (!) 132/95   Pulse 76   Temp (!) 97.3 F (36.3 C) (Oral)   Resp 15   Ht 1.829 m (6')   Wt 99.8 kg   SpO2 97%   BMI 29.84 kg/m   Physical Exam Vitals signs and nursing note reviewed.  Constitutional:      General: He is not in acute distress.    Appearance: Normal appearance. He is well-developed. He is not toxic-appearing.  HENT:     Head: Normocephalic and atraumatic.      Comments: Patient appears to have a port wine stain on his face mostly on the left side. Eyes:     General: Lids are normal.     Conjunctiva/sclera: Conjunctivae normal.     Pupils: Pupils are equal, round, and reactive to light.  Neck:     Musculoskeletal: Normal range of motion and neck supple.     Thyroid: No thyroid mass.     Trachea: No tracheal deviation.  Cardiovascular:     Rate and Rhythm: Normal rate and regular rhythm.     Heart sounds: Normal heart sounds. No murmur. No gallop.   Pulmonary:     Effort: Pulmonary effort is normal. No respiratory distress.     Breath sounds: Normal breath sounds. No stridor. No decreased breath sounds, wheezing, rhonchi or rales.  Abdominal:     General: Bowel sounds are normal. There is no distension.     Palpations: Abdomen is soft.     Tenderness: There is no abdominal tenderness. There is no rebound.  Musculoskeletal: Normal range of motion.        General: No tenderness.  Skin:    General: Skin is warm and dry.     Findings: No abrasion or rash.      Comments: Large plaque-like lesions noted to bilateral lower extremities.  Not purpuric not petechial.  No pustules.  Skin does not slough.  Neurological:     Mental Status: He is alert and oriented to person, place, and time. Mental status is at baseline.     GCS: GCS eye subscore is 4. GCS verbal subscore is 5. GCS motor subscore is 6.  Cranial Nerves: No cranial nerve deficit.     Sensory: No sensory deficit.  Psychiatric:        Attention and Perception: Attention normal.      ED Treatments / Results  Labs (all labs ordered are listed, but only abnormal results are displayed) Labs Reviewed  CBC WITH DIFFERENTIAL/PLATELET  COMPREHENSIVE METABOLIC PANEL  PROTIME-INR  APTT    EKG None  Radiology No results found.  Procedures Procedures (including critical care time)  Medications Ordered in ED Medications  0.9 %  sodium chloride infusion (has no administration in time range)     Initial Impression / Assessment and Plan / ED Course  I have reviewed the triage vital signs and the nursing notes.  Pertinent labs & imaging results that were available during my care of the patient were reviewed by me and considered in my medical decision making (see chart for details).        Patient's nurse spoke with the nursing home and they state that this rash was initially thought to be shingles started 4 days ago and was treated does not improved.  Patient's labs here are reassuring.  Patient will require dermatological follow-up  Final Clinical Impressions(s) / ED Diagnoses   Final diagnoses:  None    ED Discharge Orders    None       Lacretia Leigh, MD 01/13/19 1816

## 2019-01-13 NOTE — ED Notes (Signed)
[facility] called for pt transport back to [facility]

## 2019-01-13 NOTE — ED Notes (Signed)
Spoke with staff from [facility], they endorse: "Bright red splotches to left arm, discoloration to both legs. Pt c/o itching to extremities". Facility initially thought it was shingles and pt was treated. Facility also states pt has Left eye swelling. According to facility, symptoms first noticed on 01/09/19.

## 2019-01-15 DIAGNOSIS — M6281 Muscle weakness (generalized): Secondary | ICD-10-CM | POA: Diagnosis not present

## 2019-01-15 DIAGNOSIS — Q8789 Other specified congenital malformation syndromes, not elsewhere classified: Secondary | ICD-10-CM | POA: Diagnosis not present

## 2019-01-15 DIAGNOSIS — Z9181 History of falling: Secondary | ICD-10-CM | POA: Diagnosis not present

## 2019-01-15 DIAGNOSIS — E1140 Type 2 diabetes mellitus with diabetic neuropathy, unspecified: Secondary | ICD-10-CM | POA: Diagnosis not present

## 2019-01-16 DIAGNOSIS — M6281 Muscle weakness (generalized): Secondary | ICD-10-CM | POA: Diagnosis not present

## 2019-01-16 DIAGNOSIS — E1140 Type 2 diabetes mellitus with diabetic neuropathy, unspecified: Secondary | ICD-10-CM | POA: Diagnosis not present

## 2019-01-16 DIAGNOSIS — Q8789 Other specified congenital malformation syndromes, not elsewhere classified: Secondary | ICD-10-CM | POA: Diagnosis not present

## 2019-01-16 DIAGNOSIS — Z9181 History of falling: Secondary | ICD-10-CM | POA: Diagnosis not present

## 2019-01-17 DIAGNOSIS — E1140 Type 2 diabetes mellitus with diabetic neuropathy, unspecified: Secondary | ICD-10-CM | POA: Diagnosis not present

## 2019-01-17 DIAGNOSIS — E1151 Type 2 diabetes mellitus with diabetic peripheral angiopathy without gangrene: Secondary | ICD-10-CM | POA: Diagnosis not present

## 2019-01-17 DIAGNOSIS — D1800 Hemangioma unspecified site: Secondary | ICD-10-CM | POA: Diagnosis not present

## 2019-01-17 DIAGNOSIS — Z9181 History of falling: Secondary | ICD-10-CM | POA: Diagnosis not present

## 2019-01-17 DIAGNOSIS — R231 Pallor: Secondary | ICD-10-CM | POA: Diagnosis not present

## 2019-01-17 DIAGNOSIS — M6281 Muscle weakness (generalized): Secondary | ICD-10-CM | POA: Diagnosis not present

## 2019-01-17 DIAGNOSIS — E039 Hypothyroidism, unspecified: Secondary | ICD-10-CM | POA: Diagnosis not present

## 2019-01-17 DIAGNOSIS — Q8789 Other specified congenital malformation syndromes, not elsewhere classified: Secondary | ICD-10-CM | POA: Diagnosis not present

## 2019-01-18 DIAGNOSIS — Z9181 History of falling: Secondary | ICD-10-CM | POA: Diagnosis not present

## 2019-01-18 DIAGNOSIS — Q8789 Other specified congenital malformation syndromes, not elsewhere classified: Secondary | ICD-10-CM | POA: Diagnosis not present

## 2019-01-18 DIAGNOSIS — E1140 Type 2 diabetes mellitus with diabetic neuropathy, unspecified: Secondary | ICD-10-CM | POA: Diagnosis not present

## 2019-01-18 DIAGNOSIS — M6281 Muscle weakness (generalized): Secondary | ICD-10-CM | POA: Diagnosis not present

## 2019-01-19 DIAGNOSIS — Z9181 History of falling: Secondary | ICD-10-CM | POA: Diagnosis not present

## 2019-01-19 DIAGNOSIS — E1140 Type 2 diabetes mellitus with diabetic neuropathy, unspecified: Secondary | ICD-10-CM | POA: Diagnosis not present

## 2019-01-19 DIAGNOSIS — M6281 Muscle weakness (generalized): Secondary | ICD-10-CM | POA: Diagnosis not present

## 2019-01-19 DIAGNOSIS — Q8789 Other specified congenital malformation syndromes, not elsewhere classified: Secondary | ICD-10-CM | POA: Diagnosis not present

## 2019-01-20 DIAGNOSIS — M6281 Muscle weakness (generalized): Secondary | ICD-10-CM | POA: Diagnosis not present

## 2019-01-20 DIAGNOSIS — Q8789 Other specified congenital malformation syndromes, not elsewhere classified: Secondary | ICD-10-CM | POA: Diagnosis not present

## 2019-01-20 DIAGNOSIS — E1140 Type 2 diabetes mellitus with diabetic neuropathy, unspecified: Secondary | ICD-10-CM | POA: Diagnosis not present

## 2019-01-20 DIAGNOSIS — Z9181 History of falling: Secondary | ICD-10-CM | POA: Diagnosis not present

## 2019-01-22 DIAGNOSIS — Z9181 History of falling: Secondary | ICD-10-CM | POA: Diagnosis not present

## 2019-01-22 DIAGNOSIS — E1140 Type 2 diabetes mellitus with diabetic neuropathy, unspecified: Secondary | ICD-10-CM | POA: Diagnosis not present

## 2019-01-22 DIAGNOSIS — M6281 Muscle weakness (generalized): Secondary | ICD-10-CM | POA: Diagnosis not present

## 2019-01-22 DIAGNOSIS — Q8789 Other specified congenital malformation syndromes, not elsewhere classified: Secondary | ICD-10-CM | POA: Diagnosis not present

## 2019-01-23 DIAGNOSIS — M6281 Muscle weakness (generalized): Secondary | ICD-10-CM | POA: Diagnosis not present

## 2019-01-23 DIAGNOSIS — E1140 Type 2 diabetes mellitus with diabetic neuropathy, unspecified: Secondary | ICD-10-CM | POA: Diagnosis not present

## 2019-01-23 DIAGNOSIS — Q8789 Other specified congenital malformation syndromes, not elsewhere classified: Secondary | ICD-10-CM | POA: Diagnosis not present

## 2019-01-23 DIAGNOSIS — Z9181 History of falling: Secondary | ICD-10-CM | POA: Diagnosis not present

## 2019-01-25 DIAGNOSIS — Q8789 Other specified congenital malformation syndromes, not elsewhere classified: Secondary | ICD-10-CM | POA: Diagnosis not present

## 2019-01-25 DIAGNOSIS — Z9181 History of falling: Secondary | ICD-10-CM | POA: Diagnosis not present

## 2019-01-25 DIAGNOSIS — E1140 Type 2 diabetes mellitus with diabetic neuropathy, unspecified: Secondary | ICD-10-CM | POA: Diagnosis not present

## 2019-01-25 DIAGNOSIS — M6281 Muscle weakness (generalized): Secondary | ICD-10-CM | POA: Diagnosis not present

## 2019-01-26 DIAGNOSIS — E1140 Type 2 diabetes mellitus with diabetic neuropathy, unspecified: Secondary | ICD-10-CM | POA: Diagnosis not present

## 2019-01-26 DIAGNOSIS — M6281 Muscle weakness (generalized): Secondary | ICD-10-CM | POA: Diagnosis not present

## 2019-01-26 DIAGNOSIS — Z9181 History of falling: Secondary | ICD-10-CM | POA: Diagnosis not present

## 2019-01-26 DIAGNOSIS — Q8789 Other specified congenital malformation syndromes, not elsewhere classified: Secondary | ICD-10-CM | POA: Diagnosis not present

## 2019-01-27 DIAGNOSIS — M6281 Muscle weakness (generalized): Secondary | ICD-10-CM | POA: Diagnosis not present

## 2019-01-27 DIAGNOSIS — Q8789 Other specified congenital malformation syndromes, not elsewhere classified: Secondary | ICD-10-CM | POA: Diagnosis not present

## 2019-01-27 DIAGNOSIS — Z9181 History of falling: Secondary | ICD-10-CM | POA: Diagnosis not present

## 2019-01-27 DIAGNOSIS — E1140 Type 2 diabetes mellitus with diabetic neuropathy, unspecified: Secondary | ICD-10-CM | POA: Diagnosis not present

## 2019-01-28 DIAGNOSIS — Q8789 Other specified congenital malformation syndromes, not elsewhere classified: Secondary | ICD-10-CM | POA: Diagnosis not present

## 2019-01-28 DIAGNOSIS — E1140 Type 2 diabetes mellitus with diabetic neuropathy, unspecified: Secondary | ICD-10-CM | POA: Diagnosis not present

## 2019-01-28 DIAGNOSIS — M6281 Muscle weakness (generalized): Secondary | ICD-10-CM | POA: Diagnosis not present

## 2019-01-28 DIAGNOSIS — Z9181 History of falling: Secondary | ICD-10-CM | POA: Diagnosis not present

## 2019-01-30 DIAGNOSIS — Z9181 History of falling: Secondary | ICD-10-CM | POA: Diagnosis not present

## 2019-01-30 DIAGNOSIS — E1140 Type 2 diabetes mellitus with diabetic neuropathy, unspecified: Secondary | ICD-10-CM | POA: Diagnosis not present

## 2019-01-30 DIAGNOSIS — M6281 Muscle weakness (generalized): Secondary | ICD-10-CM | POA: Diagnosis not present

## 2019-01-30 DIAGNOSIS — Q8789 Other specified congenital malformation syndromes, not elsewhere classified: Secondary | ICD-10-CM | POA: Diagnosis not present

## 2019-01-31 DIAGNOSIS — M6281 Muscle weakness (generalized): Secondary | ICD-10-CM | POA: Diagnosis not present

## 2019-01-31 DIAGNOSIS — E1140 Type 2 diabetes mellitus with diabetic neuropathy, unspecified: Secondary | ICD-10-CM | POA: Diagnosis not present

## 2019-01-31 DIAGNOSIS — Z9181 History of falling: Secondary | ICD-10-CM | POA: Diagnosis not present

## 2019-01-31 DIAGNOSIS — Q8789 Other specified congenital malformation syndromes, not elsewhere classified: Secondary | ICD-10-CM | POA: Diagnosis not present

## 2019-02-02 DIAGNOSIS — M6281 Muscle weakness (generalized): Secondary | ICD-10-CM | POA: Diagnosis not present

## 2019-02-02 DIAGNOSIS — E1140 Type 2 diabetes mellitus with diabetic neuropathy, unspecified: Secondary | ICD-10-CM | POA: Diagnosis not present

## 2019-02-02 DIAGNOSIS — Q8789 Other specified congenital malformation syndromes, not elsewhere classified: Secondary | ICD-10-CM | POA: Diagnosis not present

## 2019-02-02 DIAGNOSIS — Z9181 History of falling: Secondary | ICD-10-CM | POA: Diagnosis not present

## 2019-02-03 DIAGNOSIS — Z9181 History of falling: Secondary | ICD-10-CM | POA: Diagnosis not present

## 2019-02-03 DIAGNOSIS — M6281 Muscle weakness (generalized): Secondary | ICD-10-CM | POA: Diagnosis not present

## 2019-02-03 DIAGNOSIS — Q8789 Other specified congenital malformation syndromes, not elsewhere classified: Secondary | ICD-10-CM | POA: Diagnosis not present

## 2019-02-03 DIAGNOSIS — E1140 Type 2 diabetes mellitus with diabetic neuropathy, unspecified: Secondary | ICD-10-CM | POA: Diagnosis not present

## 2019-02-04 DIAGNOSIS — E1140 Type 2 diabetes mellitus with diabetic neuropathy, unspecified: Secondary | ICD-10-CM | POA: Diagnosis not present

## 2019-02-04 DIAGNOSIS — M6281 Muscle weakness (generalized): Secondary | ICD-10-CM | POA: Diagnosis not present

## 2019-02-04 DIAGNOSIS — Z9181 History of falling: Secondary | ICD-10-CM | POA: Diagnosis not present

## 2019-02-04 DIAGNOSIS — Q8789 Other specified congenital malformation syndromes, not elsewhere classified: Secondary | ICD-10-CM | POA: Diagnosis not present

## 2019-02-06 DIAGNOSIS — Q8789 Other specified congenital malformation syndromes, not elsewhere classified: Secondary | ICD-10-CM | POA: Diagnosis not present

## 2019-02-06 DIAGNOSIS — M6281 Muscle weakness (generalized): Secondary | ICD-10-CM | POA: Diagnosis not present

## 2019-02-06 DIAGNOSIS — E1140 Type 2 diabetes mellitus with diabetic neuropathy, unspecified: Secondary | ICD-10-CM | POA: Diagnosis not present

## 2019-02-06 DIAGNOSIS — Z9181 History of falling: Secondary | ICD-10-CM | POA: Diagnosis not present

## 2019-02-07 DIAGNOSIS — Q8789 Other specified congenital malformation syndromes, not elsewhere classified: Secondary | ICD-10-CM | POA: Diagnosis not present

## 2019-02-07 DIAGNOSIS — Z9181 History of falling: Secondary | ICD-10-CM | POA: Diagnosis not present

## 2019-02-07 DIAGNOSIS — E1140 Type 2 diabetes mellitus with diabetic neuropathy, unspecified: Secondary | ICD-10-CM | POA: Diagnosis not present

## 2019-02-07 DIAGNOSIS — M6281 Muscle weakness (generalized): Secondary | ICD-10-CM | POA: Diagnosis not present

## 2019-02-08 DIAGNOSIS — Z9181 History of falling: Secondary | ICD-10-CM | POA: Diagnosis not present

## 2019-02-08 DIAGNOSIS — Q8789 Other specified congenital malformation syndromes, not elsewhere classified: Secondary | ICD-10-CM | POA: Diagnosis not present

## 2019-02-08 DIAGNOSIS — E1140 Type 2 diabetes mellitus with diabetic neuropathy, unspecified: Secondary | ICD-10-CM | POA: Diagnosis not present

## 2019-02-08 DIAGNOSIS — M6281 Muscle weakness (generalized): Secondary | ICD-10-CM | POA: Diagnosis not present

## 2019-02-09 DIAGNOSIS — Z9181 History of falling: Secondary | ICD-10-CM | POA: Diagnosis not present

## 2019-02-09 DIAGNOSIS — Q8789 Other specified congenital malformation syndromes, not elsewhere classified: Secondary | ICD-10-CM | POA: Diagnosis not present

## 2019-02-09 DIAGNOSIS — M6281 Muscle weakness (generalized): Secondary | ICD-10-CM | POA: Diagnosis not present

## 2019-02-09 DIAGNOSIS — E1140 Type 2 diabetes mellitus with diabetic neuropathy, unspecified: Secondary | ICD-10-CM | POA: Diagnosis not present

## 2019-02-10 DIAGNOSIS — E1140 Type 2 diabetes mellitus with diabetic neuropathy, unspecified: Secondary | ICD-10-CM | POA: Diagnosis not present

## 2019-02-10 DIAGNOSIS — Z9181 History of falling: Secondary | ICD-10-CM | POA: Diagnosis not present

## 2019-02-10 DIAGNOSIS — M6281 Muscle weakness (generalized): Secondary | ICD-10-CM | POA: Diagnosis not present

## 2019-02-10 DIAGNOSIS — Q8789 Other specified congenital malformation syndromes, not elsewhere classified: Secondary | ICD-10-CM | POA: Diagnosis not present

## 2019-03-01 DIAGNOSIS — G40909 Epilepsy, unspecified, not intractable, without status epilepticus: Secondary | ICD-10-CM | POA: Diagnosis not present

## 2019-03-01 DIAGNOSIS — Q858 Other phakomatoses, not elsewhere classified: Secondary | ICD-10-CM | POA: Diagnosis not present

## 2019-03-01 DIAGNOSIS — F79 Unspecified intellectual disabilities: Secondary | ICD-10-CM | POA: Diagnosis not present

## 2019-03-01 DIAGNOSIS — U071 COVID-19: Secondary | ICD-10-CM | POA: Diagnosis not present

## 2019-03-01 DIAGNOSIS — E1151 Type 2 diabetes mellitus with diabetic peripheral angiopathy without gangrene: Secondary | ICD-10-CM | POA: Diagnosis not present

## 2019-03-06 DIAGNOSIS — Z20828 Contact with and (suspected) exposure to other viral communicable diseases: Secondary | ICD-10-CM | POA: Diagnosis not present

## 2019-03-14 DIAGNOSIS — Z03818 Encounter for observation for suspected exposure to other biological agents ruled out: Secondary | ICD-10-CM | POA: Diagnosis not present

## 2019-03-14 DIAGNOSIS — Z20828 Contact with and (suspected) exposure to other viral communicable diseases: Secondary | ICD-10-CM | POA: Diagnosis not present

## 2019-10-12 IMAGING — CT CT HEAD W/O CM
3 series · 15 of 47 positions shown, 18 images · non-contrast
Comparison: CT head without contrast 09/29/2016 and 11/24/2015.

CLINICAL DATA: Seizure like activity. Seizure disorder.
Sturge-Weber syndrome.

EXAM:
CT HEAD WITHOUT CONTRAST
TECHNIQUE: Contiguous axial images were obtained from the base of the skull
through the vertex without intravenous contrast.

[Series 2: head wo · axial · 0.48mm/px · z∈[-31,+99]mm · 9 of 32 slices shown, 12 images]
[im 3/32  brain]
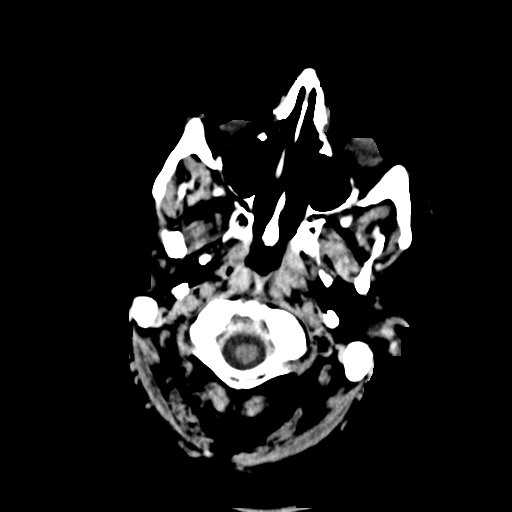
[im 3/32  bone]
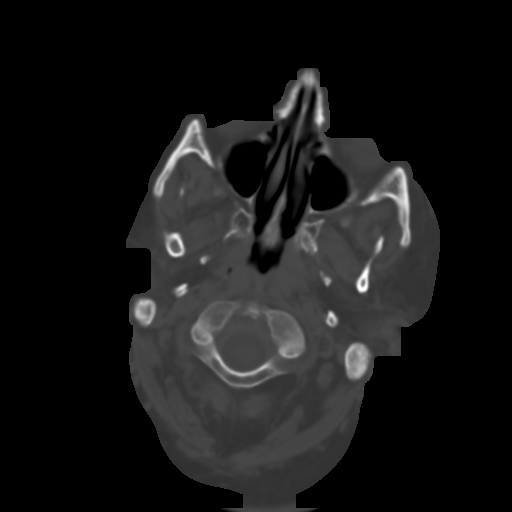
[im 6/32  brain]
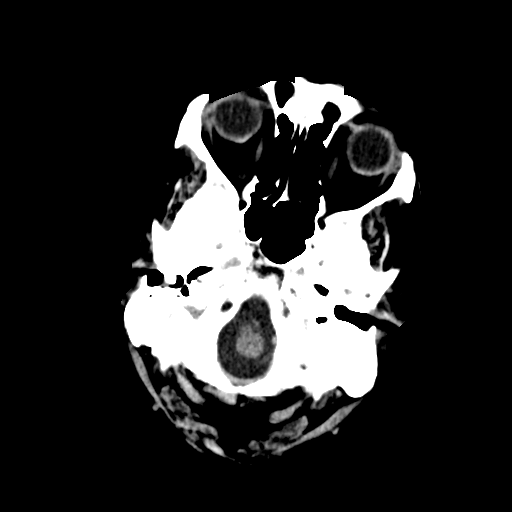
[im 9/32  brain]
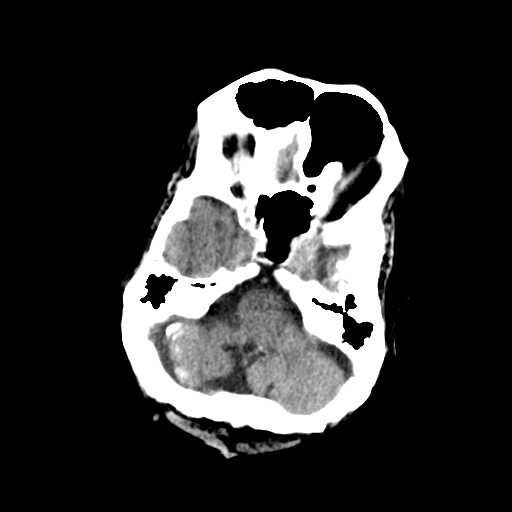
[im 12/32  brain]
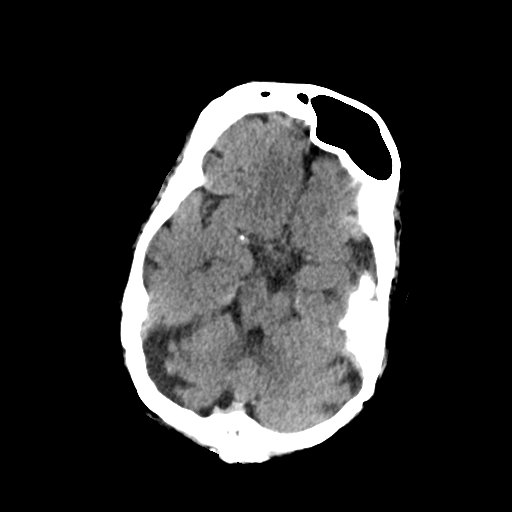
[im 17/32  brain]
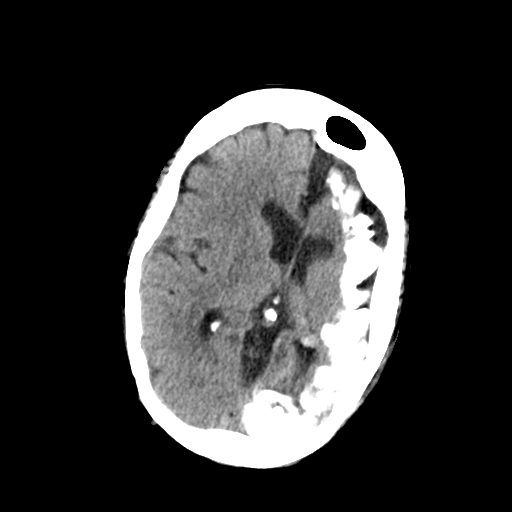
[im 17/32  bone]
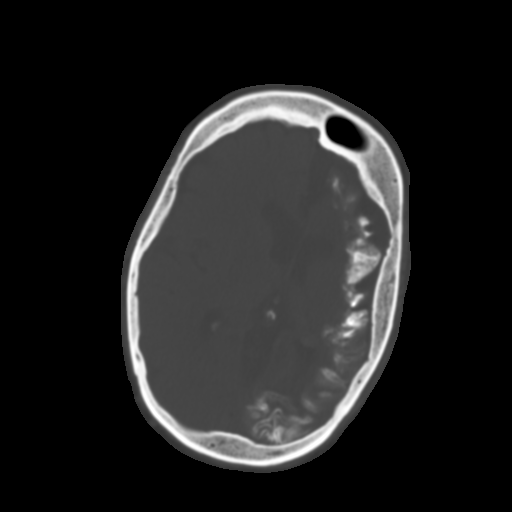
[im 20/32  brain]
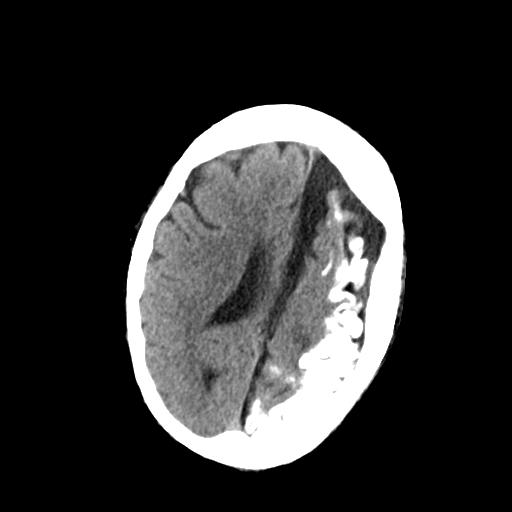
[im 23/32  brain]
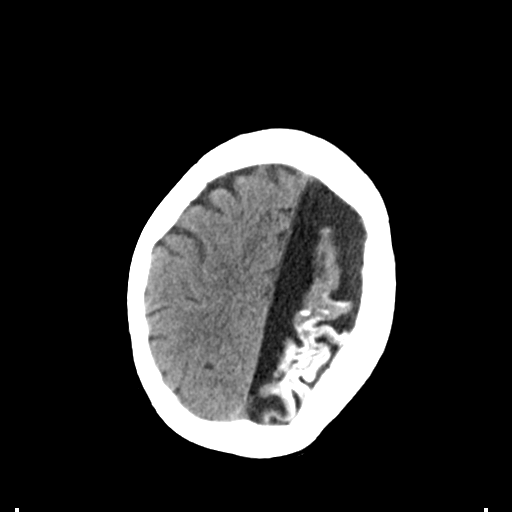
[im 26/32  brain]
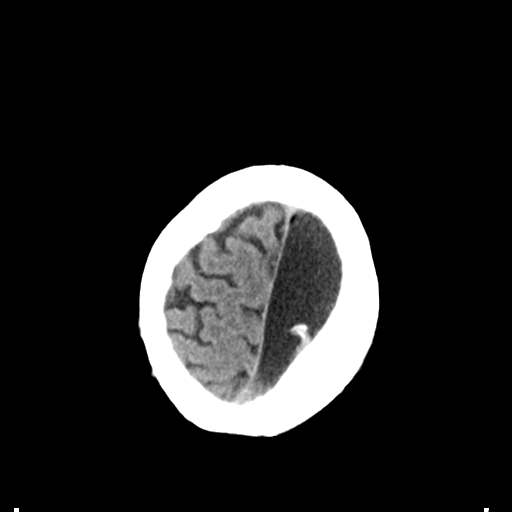
[im 29/32  brain]
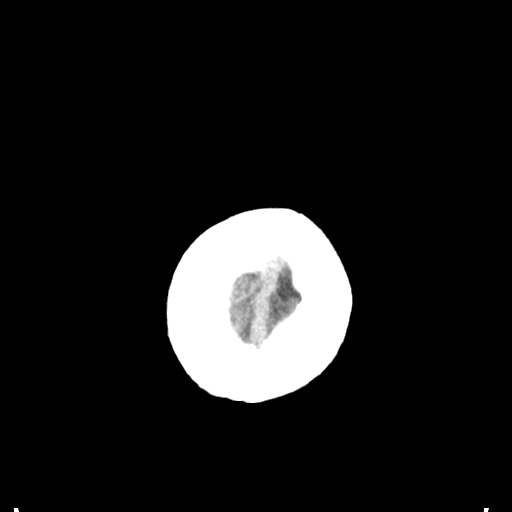
[im 29/32  bone]
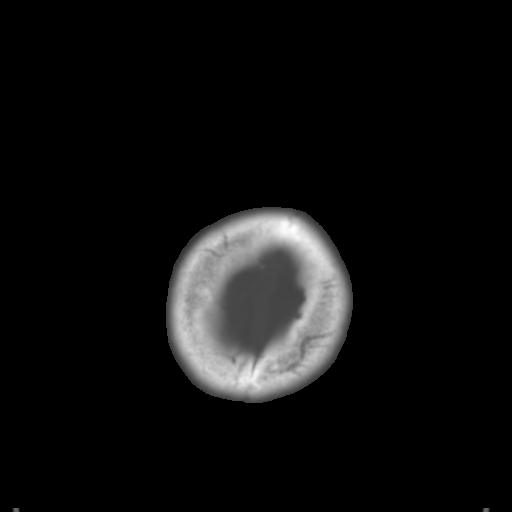

[Series 4: coronal soft tissue · coronal · 0.32mm/px · 3 of 75 slices shown]
[im 25/75  brain]
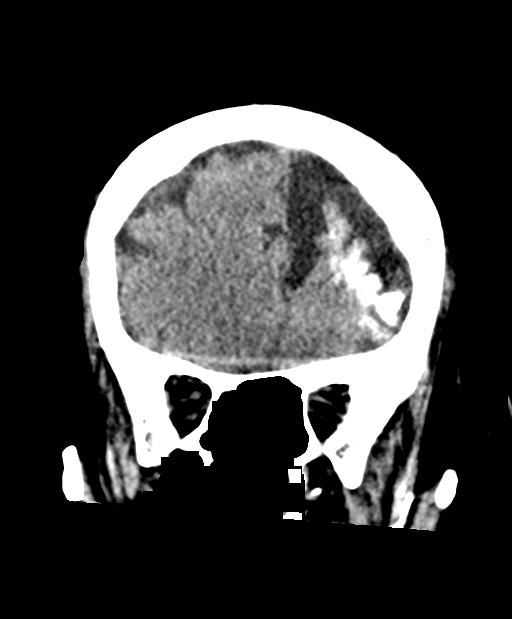
[im 33/75  brain]
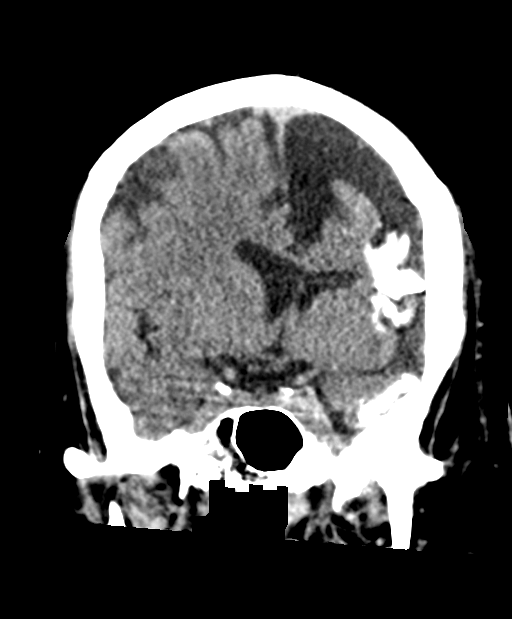
[im 42/75  brain]
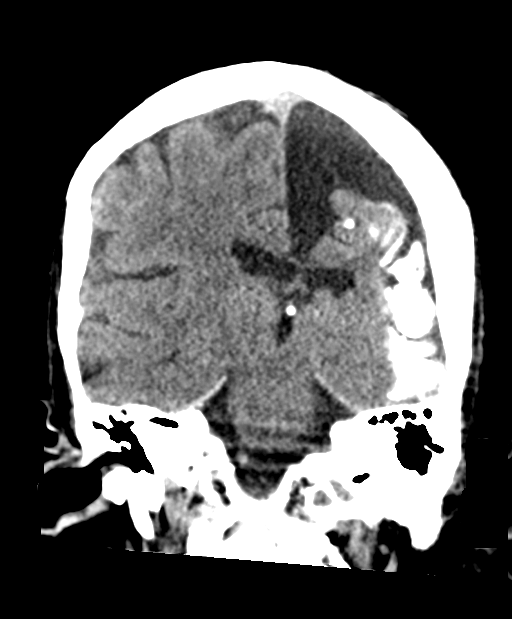

[Series 5: sagittal soft tissue · sagittal · 0.36mm/px · 3 of 56 slices shown]
[im 19/56  brain]
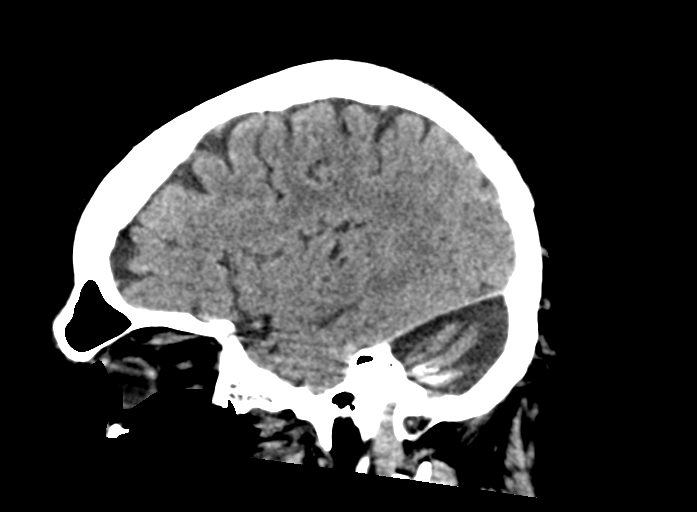
[im 28/56  brain]
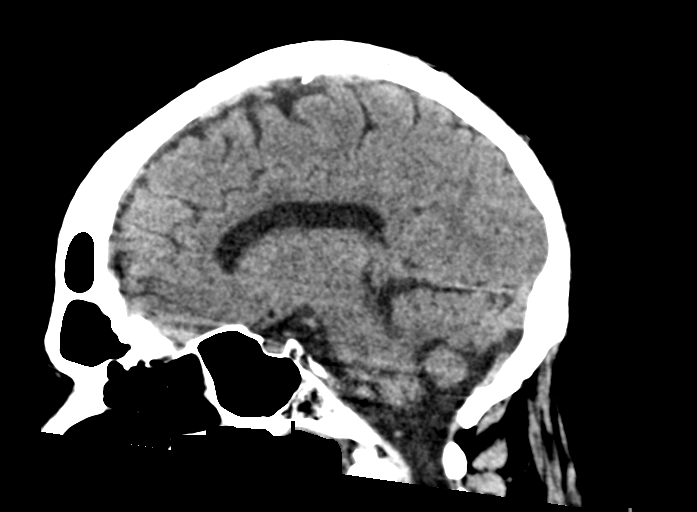
[im 37/56  brain]
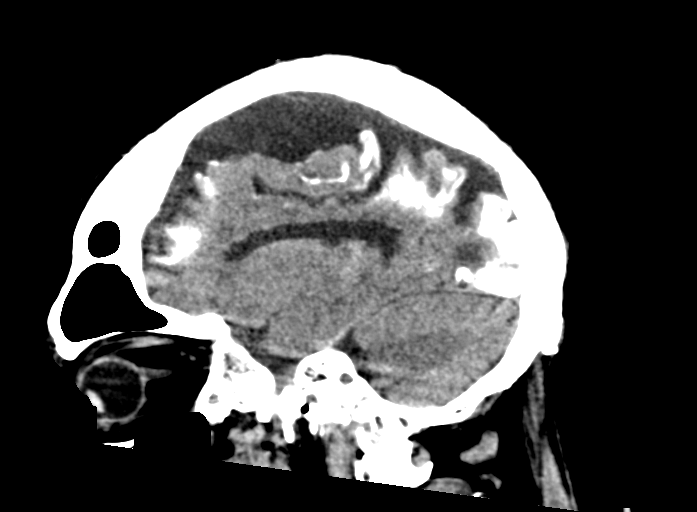

[15 of 47 positions shown; findings below may reference images not displayed]

FINDINGS: Brain: Chronic left hemispheric atrophy calcifications are again
noted. No acute infarct, hemorrhage, or mass lesion is present.
Right hemispheres unremarkable. Mild cerebellar atrophy is worse on
the right, unchanged. No discrete cerebellar or brainstem lesion is
present.

Vascular: No hyperdense vessel or unexpected calcification.

Skull: Asymmetric left calvarial thickening is commensurate to the
left-sided atrophy.

Sinuses/Orbits: The paranasal sinuses and mastoid air cells are
clear. Globes and orbits are within normal limits.
IMPRESSION: 1. Chronic left-sided hemispheric atrophy and calcifications
compatible with Sturge-Weber syndrome. This is likely the seizure
focus.
2. No acute intracranial abnormality or interval change.

## 2023-01-12 DEATH — deceased
